# Patient Record
Sex: Male | Born: 1950 | Race: White | Hispanic: No | Marital: Married | Smoking: Former smoker
Health system: Southern US, Community
[De-identification: ages and names within clinical notes are randomized; demographics above are authoritative.]

## PROBLEM LIST (undated history)

## (undated) DIAGNOSIS — R079 Chest pain, unspecified: Secondary | ICD-10-CM

## (undated) DIAGNOSIS — G4730 Sleep apnea, unspecified: Secondary | ICD-10-CM

## (undated) DIAGNOSIS — Z87442 Personal history of urinary calculi: Secondary | ICD-10-CM

## (undated) DIAGNOSIS — I10 Essential (primary) hypertension: Secondary | ICD-10-CM

## (undated) DIAGNOSIS — M109 Gout, unspecified: Secondary | ICD-10-CM

## (undated) DIAGNOSIS — K219 Gastro-esophageal reflux disease without esophagitis: Secondary | ICD-10-CM

## (undated) DIAGNOSIS — E119 Type 2 diabetes mellitus without complications: Secondary | ICD-10-CM

## (undated) DIAGNOSIS — C4490 Unspecified malignant neoplasm of skin, unspecified: Secondary | ICD-10-CM

## (undated) DIAGNOSIS — R002 Palpitations: Secondary | ICD-10-CM

## (undated) DIAGNOSIS — I639 Cerebral infarction, unspecified: Secondary | ICD-10-CM

## (undated) DIAGNOSIS — M5030 Other cervical disc degeneration, unspecified cervical region: Secondary | ICD-10-CM

## (undated) DIAGNOSIS — G4733 Obstructive sleep apnea (adult) (pediatric): Secondary | ICD-10-CM

## (undated) DIAGNOSIS — E785 Hyperlipidemia, unspecified: Secondary | ICD-10-CM

## (undated) HISTORY — PX: FOOT SURGERY: SHX648

## (undated) HISTORY — PX: BACK SURGERY: SHX140

## (undated) HISTORY — PX: CATARACT EXTRACTION: SUR2

## (undated) HISTORY — PX: APPENDECTOMY: SHX54

## (undated) HISTORY — PX: TONSILLECTOMY: SUR1361

## (undated) HISTORY — DX: Sleep apnea, unspecified: G47.30

## (undated) HISTORY — DX: Type 2 diabetes mellitus without complications: E11.9

## (undated) HISTORY — PX: KNEE SURGERY: SHX244

## (undated) HISTORY — PX: HEMORROIDECTOMY: SUR656

---

## 2005-02-25 ENCOUNTER — Ambulatory Visit: Payer: Self-pay | Admitting: Cardiology

## 2005-02-25 ENCOUNTER — Ambulatory Visit (HOSPITAL_COMMUNITY): Admission: AD | Admit: 2005-02-25 | Discharge: 2005-02-25 | Payer: Self-pay | Admitting: Cardiology

## 2006-01-02 ENCOUNTER — Emergency Department (HOSPITAL_COMMUNITY): Admission: EM | Admit: 2006-01-02 | Discharge: 2006-01-02 | Payer: Self-pay | Admitting: Emergency Medicine

## 2008-08-27 ENCOUNTER — Encounter: Payer: Self-pay | Admitting: Internal Medicine

## 2008-08-27 ENCOUNTER — Ambulatory Visit (HOSPITAL_COMMUNITY): Admission: RE | Admit: 2008-08-27 | Discharge: 2008-08-28 | Payer: Self-pay | Admitting: Neurosurgery

## 2008-11-28 ENCOUNTER — Ambulatory Visit: Payer: Self-pay

## 2008-11-28 ENCOUNTER — Ambulatory Visit: Payer: Self-pay | Admitting: Internal Medicine

## 2008-11-28 ENCOUNTER — Encounter: Payer: Self-pay | Admitting: Internal Medicine

## 2008-11-28 ENCOUNTER — Encounter: Payer: Self-pay | Admitting: Cardiology

## 2008-11-28 DIAGNOSIS — E785 Hyperlipidemia, unspecified: Secondary | ICD-10-CM | POA: Insufficient documentation

## 2008-11-28 DIAGNOSIS — R509 Fever, unspecified: Secondary | ICD-10-CM

## 2008-11-28 DIAGNOSIS — R002 Palpitations: Secondary | ICD-10-CM

## 2008-11-28 DIAGNOSIS — M79609 Pain in unspecified limb: Secondary | ICD-10-CM

## 2008-11-28 DIAGNOSIS — I10 Essential (primary) hypertension: Secondary | ICD-10-CM | POA: Insufficient documentation

## 2008-11-28 DIAGNOSIS — R0602 Shortness of breath: Secondary | ICD-10-CM

## 2008-11-28 DIAGNOSIS — M549 Dorsalgia, unspecified: Secondary | ICD-10-CM | POA: Insufficient documentation

## 2008-11-28 DIAGNOSIS — R0789 Other chest pain: Secondary | ICD-10-CM

## 2008-11-28 DIAGNOSIS — Z85828 Personal history of other malignant neoplasm of skin: Secondary | ICD-10-CM

## 2008-11-28 LAB — CONVERTED CEMR LAB
Basophils Relative: 0.2 % (ref 0.0–3.0)
CO2: 29 meq/L (ref 19–32)
Chloride: 102 meq/L (ref 96–112)
Creatinine, Ser: 1 mg/dL (ref 0.4–1.5)
Eosinophils Relative: 1.1 % (ref 0.0–5.0)
GFR calc non Af Amer: 81.29 mL/min (ref >60)
Glucose, Bld: 88 mg/dL (ref 70–99)
Lymphocytes Relative: 19.4 % (ref 12.0–46.0)
Lymphs Abs: 1.5 10*3/uL (ref 0.7–4.0)
MCHC: 34 g/dL (ref 30.0–36.0)
MCV: 93 fL (ref 78.0–100.0)
Neutrophils Relative %: 64.1 % (ref 43.0–77.0)
Platelets: 148 10*3/uL — ABNORMAL LOW (ref 150.0–400.0)
RDW: 12.8 % (ref 11.5–14.6)
Total Protein: 7.3 g/dL (ref 6.0–8.3)

## 2009-01-01 ENCOUNTER — Ambulatory Visit: Payer: Self-pay | Admitting: Internal Medicine

## 2009-01-06 ENCOUNTER — Telehealth (INDEPENDENT_AMBULATORY_CARE_PROVIDER_SITE_OTHER): Payer: Self-pay | Admitting: *Deleted

## 2009-01-07 ENCOUNTER — Encounter: Payer: Self-pay | Admitting: Cardiovascular Disease

## 2009-01-07 ENCOUNTER — Ambulatory Visit: Payer: Self-pay

## 2010-05-15 ENCOUNTER — Inpatient Hospital Stay (HOSPITAL_COMMUNITY)
Admission: EM | Admit: 2010-05-15 | Discharge: 2010-05-17 | Payer: Self-pay | Source: Home / Self Care | Attending: Neurology | Admitting: Neurology

## 2010-05-15 ENCOUNTER — Encounter
Admission: RE | Admit: 2010-05-15 | Discharge: 2010-05-15 | Payer: Self-pay | Source: Home / Self Care | Attending: Otolaryngology | Admitting: Otolaryngology

## 2010-05-16 ENCOUNTER — Encounter (INDEPENDENT_AMBULATORY_CARE_PROVIDER_SITE_OTHER): Payer: Self-pay | Admitting: Neurology

## 2010-05-20 ENCOUNTER — Encounter: Payer: Self-pay | Admitting: Internal Medicine

## 2010-05-20 ENCOUNTER — Ambulatory Visit: Admission: RE | Admit: 2010-05-20 | Discharge: 2010-05-20 | Payer: Self-pay | Source: Home / Self Care

## 2010-05-20 ENCOUNTER — Ambulatory Visit (HOSPITAL_COMMUNITY)
Admission: RE | Admit: 2010-05-20 | Discharge: 2010-05-20 | Payer: Self-pay | Source: Home / Self Care | Attending: Internal Medicine | Admitting: Internal Medicine

## 2010-06-14 ENCOUNTER — Encounter: Payer: Self-pay | Admitting: Otolaryngology

## 2010-08-03 LAB — COMPREHENSIVE METABOLIC PANEL
ALT: 43 U/L (ref 0–53)
AST: 48 U/L — ABNORMAL HIGH (ref 0–37)
Albumin: 3.4 g/dL — ABNORMAL LOW (ref 3.5–5.2)
Alkaline Phosphatase: 62 U/L (ref 39–117)
BUN: 8 mg/dL (ref 6–23)
CO2: 28 mEq/L (ref 19–32)
Calcium: 8.1 mg/dL — ABNORMAL LOW (ref 8.4–10.5)
Chloride: 105 mEq/L (ref 96–112)
Creatinine, Ser: 1.04 mg/dL (ref 0.4–1.5)
GFR calc Af Amer: >60 mL/min (ref >60)
GFR calc non Af Amer: >60 mL/min (ref >60)
GFR calc non Af Amer: >60 mL/min (ref >60)
Glucose, Bld: 102 mg/dL — ABNORMAL HIGH (ref 70–99)
Total Protein: 6.2 g/dL (ref 6.0–8.3)
Total Protein: 7 g/dL (ref 6.0–8.3)

## 2010-08-03 LAB — DIFFERENTIAL
Eosinophils Absolute: 0 10*3/uL (ref 0.0–0.7)
Lymphocytes Relative: 33 % (ref 12–46)
Lymphs Abs: 2.3 10*3/uL (ref 0.7–4.0)
Neutrophils Relative %: 62 % (ref 43–77)

## 2010-08-03 LAB — APTT
aPTT: 27 seconds (ref 24–37)
aPTT: 27 seconds (ref 24–37)

## 2010-08-03 LAB — PROTIME-INR
INR: 1.15 (ref 0.00–1.49)
Prothrombin Time: 15.2 seconds (ref 11.6–15.2)

## 2010-08-03 LAB — CK TOTAL AND CKMB (NOT AT ARMC): Relative Index: 1.7 (ref 0.0–2.5)

## 2010-08-03 LAB — LIPID PANEL
Cholesterol: 69 mg/dL (ref 0–200)
HDL: 25 mg/dL — ABNORMAL LOW (ref >39)
LDL Cholesterol: 28 mg/dL (ref 0–99)
Triglycerides: 79 mg/dL (ref <150)

## 2010-08-03 LAB — CBC
HCT: 46 % (ref 39.0–52.0)
Hemoglobin: 15.6 g/dL (ref 13.0–17.0)
MCHC: 33.9 g/dL (ref 30.0–36.0)
Platelets: 164 10*3/uL (ref 150–400)
RBC: 4.99 MIL/uL (ref 4.22–5.81)

## 2010-08-03 LAB — GLUCOSE, CAPILLARY: Glucose-Capillary: 90 mg/dL (ref 70–99)

## 2010-08-03 LAB — HEMOGLOBIN A1C: Mean Plasma Glucose: 140 mg/dL — ABNORMAL HIGH (ref <117)

## 2010-09-02 LAB — CBC
MCHC: 34 g/dL (ref 30.0–36.0)
MCV: 93.8 fL (ref 78.0–100.0)
RDW: 14 % (ref 11.5–15.5)

## 2010-09-02 LAB — BASIC METABOLIC PANEL
CO2: 32 mEq/L (ref 19–32)
Calcium: 9.3 mg/dL (ref 8.4–10.5)
Chloride: 101 mEq/L (ref 96–112)
Creatinine, Ser: 1 mg/dL (ref 0.4–1.5)
Glucose, Bld: 101 mg/dL — ABNORMAL HIGH (ref 70–99)

## 2010-10-06 NOTE — H&P (Signed)
Alexander [name], Alexander [name]                   ACCOUNT NO.:  [id number]   MEDICAL RECORD NO.:  [id number]          PATIENT TYPE:  OIB   LOCATION:  3015                         FACILITY:  [facility]   PHYSICIAN:  Hilda Lias, M.D.   DATE OF BIRTH:  01-Sep-1950   DATE OF ADMISSION:  08/27/2008  DATE OF DISCHARGE:                              HISTORY & PHYSICAL   Alexander [name] is a gentleman who was seen by me about 8 weeks ago for back  pain radiating to the right leg.  The patient has had conservative  treatment.  He had an MRI.  This patient in 1988 underwent a right 4-5  and 5-1 discectomy .  He denies any pain in the left leg.  He denies  problems with any back pain.  He has epidural injections, conservative  treatment with medication.  He came yesterday with his wife, things  again getting worse for more weakness, and he wants to proceed with  surgery.   PAST MEDICAL HISTORY:  A 4-5, 5-1 diskectomy in 1998.   ALLERGIES:  He is not allergic to any medications.   SOCIAL HISTORY:  Positive for back pain, right leg pain, and high blood  pressure.   PHYSICAL EXAMINATION:  GENERAL:  The patient came to my office with  right leg pain.  HEAD, EARS, NOSE, AND THROAT:  Normal.  NECK:  Normal.  LUNGS:  There is mild rhonchi bilaterally.  CARDIOVASCULAR:  Normal.  ABDOMEN:  Normal.  EXTREMITIES:  Normal pulses.  NEUROLOGIC:  Reflexes symmetrical.  On straight leg raising,  left side  positive about 80, right side about 45 degrees.  Reflexes are 1+.   MRI showed that he has degenerative disk disease with facet arthropathy  at L4-5 and L5-S1.  The patient was recommended for surgery.  He only  has pain in the right leg and the procedure will be a right L4-5  diskectomy. He is fully aware that this problem would not address the  back pain which is secondary to degenerative arthritis as well as the  potential to have a problem with the left leg.  The risk of infection,  CSF leak, failure of the surgery,  and __________ .           ______________________________  Hilda Lias, M.D.     EB/MEDQ  D:  08/27/2008  T:  08/28/2008  Job:  [id number]

## 2010-10-06 NOTE — Op Note (Signed)
Alexander [name], Alexander [name]                   ACCOUNT NO.:  [id number]   MEDICAL RECORD NO.:  [id number]          PATIENT TYPE:  OIB   LOCATION:  3015                         FACILITY:  [facility]   PHYSICIAN:  Hilda Lias, M.D.   DATE OF BIRTH:  08/26/1950   DATE OF PROCEDURE:  08/27/2008  DATE OF DISCHARGE:                               OPERATIVE REPORT   PREOPERATIVE DIAGNOSES:  Right L4-5 herniated disk with degenerative  disk disease at L4-5, L5-S1.   POSTOPERATIVE DIAGNOSES:  Right L4-5 herniated disk with degenerative  disk disease at L4-5, L5-S1.   PROCEDURES:  Right L4-5 diskectomy, foraminotomy, and lysis of  adhesions.   SURGEON:  Hilda Lias, MD   CLINICAL HISTORY:  Mr. Nevills is a gentleman who 20 years ago underwent  surgery in the right side at the level of L4-5 L5-S1.  Lately, he had  been complaining of more back pain going to the right leg.  He had  failed conservative treatment.  X-rays showed the degenerative disk  disease with a herniated disk at the level L4-5.  Degenerative disk  disease, mostly is at L4-5, L5-S1.  The patient has minimal back pain  and some numbness in the left leg, but the main pain is in the right  leg.  Because of that, surgery was advised which did not include fusion.   PROCEDURE IN DETAIL:  The patient was taken to the OR and after  intubation, he was positioned in a prone manner.  The skin was cleaned  with DuraPrep.  Midline incision following the previous wound was made  through the skin and subcutaneous tissue.  We found quite a bit of scar  tissue.  Lysis was accomplished.  We brought the microscope into the  area after we introduced a retractor.  The first x-rays showed that we  were at the level of L5-S1.  From then on, using the drill as well as  the microcurette, we started working on our way at the level of L4-5.  The patient previously had a L5 hemilaminectomy and lysis of adhesion  was accomplished, we found the thecal sac.  Then  we were able to retract  the thecal sac and we entered the disk space, quite a bit of  degenerative disk disease was removed.  We were able to clean up of scar  tissue, normally the L5, L4 nerve root.  From then on, we worked our way  down and we found quite a bit of adhesion at the level of L5-S1  involving the S1 nerve root.  There was some narrowing of the foramen  and foraminotomy was accomplished.  Then, we had a decompression of the  L4, L5, S1 nerve root.  The area was irrigated.  Valsalva maneuver was  negative.  Fentanyl and Depo-Medrol were left in the dural space and the  wound was closed with Vicryl and Steri-Strips.           ______________________________  Hilda Lias, M.D.     EB/MEDQ  D:  08/27/2008  T:  08/28/2008  Job:  [id number]

## 2010-10-09 NOTE — Discharge Summary (Signed)
NAMECAIDON, FOTI                   ACCOUNT NO.:  [id number]   MEDICAL RECORD NO.:  [id number]          PATIENT TYPE:  INP   LOCATION:  2853                         FACILITY:  [facility]   PHYSICIAN:  Salvadore Farber, M.D. LHCDATE OF BIRTH:  August 15, 1950   DATE OF ADMISSION:  02/25/2005  DATE OF DISCHARGE:  02/25/2005                                 DISCHARGE SUMMARY   PROCEDURE:  Cardiac catheterization, performed February 25, 2005, by Salvadore Farber, M.D.   PRIMARY DIAGNOSIS:  Musculoskeletal chest pain.   ASSOCIATED DIAGNOSES:  Hypertension.   HOSPITAL COURSE:  Mr. Alexander [name] is a 60 year old gentleman with hypertension and  dyslipidemia, who presented with several days of left-sided sharp chest  discomfort.  He was evaluated at [facility] by Dr. Andee Lineman.  Electrocardiogram demonstrated small Q-waves in III and aVF with some  suspicious coving in the inferior ST segments without diagnostic ST  elevation.  He was hemodynamically stable.  Due to worrisome chest  discomfort, he was transferred urgently for cardiac catheterization.  I  performed this procedure and it demonstrated angiographically normal  coronary arteries with normal left ventricular size and systolic function.  Arch aortography demonstrated no evidence of aortic aneurysm or dissection.  D-dimer was within normal limits, effectively excluding pulmonary embolism  as the etiology given the low pre-test probability.  He was then discharged  home with a recommendation for follow-up with his primary care doctor should  his pain not improve over the subsequent week.  I should note that on the  week prior to admission, rib films had been normal.   DISCHARGE MEDICATIONS:  1.  Nadolol 40 mg per day as prior to hospitalization.  2.  Tylenol p.r.n. pain.   FOLLOW-UP:  The patient is to follow up with Dr. Kirstie Peri in [place].      Salvadore Farber, M.D. [facility]  Electronically Signed     WED/MEDQ  D:  06/02/2005  T:   06/02/2005  Job:  [id number]   cc:   Learta Codding, M.D. [facility]  [address]  [address]  [place]  [place] [address]   Kirstie Peri, MD  Fax: [phone]

## 2010-10-09 NOTE — Cardiovascular Report (Signed)
Alexander [name], Alexander [name]                   ACCOUNT NO.:  [id number]   MEDICAL RECORD NO.:  [id number]          PATIENT TYPE:  INP   LOCATION:  2853                         FACILITY:  [facility]   PHYSICIAN:  Salvadore Farber, M.D. LHCDATE OF BIRTH:  04/25/1951   DATE OF PROCEDURE:  02/25/2005  DATE OF DISCHARGE:                              CARDIAC CATHETERIZATION   PROCEDURE:  Left heart catheterization, left ventriculography, coronary  angiography, arch aortography, StarClose closure of the right common femoral  arteriotomy site.   INDICATIONS:  [name] is a 60 year old gentleman without prior history of  coronary disease.  He presents with 1 week of near constant left-sided chest  discomfort which is fairly focal on the lateral wall of his chest.  He  denies any trauma.  In addition to this, he had an episode of substernal  chest discomfort occurring yesterday and accompanied by some mild nausea and  light-headedness.  That symptom has not recurred.  He was seen in [place] today  by Dr. Andee Lineman, who recommended urgent angiography.  The patient was  transferred via EMS directly to the catheterization lab for this.   PROCEDURAL TECHNIQUE:  Informed consent was obtained.  Under 1% lidocaine  local anesthesia, a 5 French sheath was placed in the right common femoral  artery using the modified Seldinger technique.  Diagnostic angiography and  ventriculography were performed using JL-4, JR-4, and pigtail catheters.  The pigtail catheter was then pulled back into the aortic root.  Arch  aortography was performed by power injection.  The arteriotomy was then  closed using a StarClose device.  Complete hemostasis was obtained.   COMPLICATIONS:  None.   FINDINGS:  1.  LV:  107/7/14.  EF 65%, without regional wall motion abnormality.  2.  No aortic stenosis or mitral regurgitation.  3.  Left main:  Angiographically normal.  4.  LAD:  Large vessel, giving rise to a large first and small second  diagonal branch.  It is angiographically normal.  5.  Circumflex:  Moderate-size vessel, giving rise to a single obtuse      marginal.  It is angiographically normal.  6.  RCA:  Moderate-size dominant vessel.  It is angiographically normal.  7.  Ascending aorta and aortic arch:  No evidence of dissection or aneurysm.      There is normal origin to the great vessels.   IMPRESSION/RECOMMENDATIONS:  The patient has angiographically normal  coronary arteries, normal left ventricular size and systolic function, and a  normal ascending and transverse aorta.  I suspect a musculoskeletal etiology  to his chest discomfort.  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  Will aim for  discharge later today if the D-dimer is not elevated.      Salvadore Farber, M.D. [facility]  Electronically Signed     WED/MEDQ  D:  02/25/2005  T:  02/26/2005  Job:  [id number]

## 2010-10-09 NOTE — H&P (Signed)
NAME:  Alexander [name], Alexander [name] NO.:  [id number]   MEDICAL RECORD NO.:  [id number]           PATIENT TYPE:   LOCATION:                                 FACILITY:   PHYSICIAN:  Learta Codding, M.D. [facility]     DATE OF BIRTH:   DATE OF ADMISSION:  DATE OF DISCHARGE:                                HISTORY & PHYSICAL   REFERRING PHYSICIAN:  Dr. Sherryll Burger   CARDIOLOGIST:  Dr. Andee Lineman   REASON FOR ADMISSION:  Substernal chest pain in a 60 year old male with  possible myocardial infarction.   HISTORY OF PRESENT ILLNESS:  The patient is a 60 year old male with a  history of dyslipidemia and hypertension. The patient is the father-in-law  of Ricky Ala, our hospital coordinator. I received a call from her this  morning because of chest pains that her father-in-law had been experiencing.  We worked him into the schedule today.   Mr. Cwikla tells me that for the last week or so he has had some left-sided,  sharp chest pains. He initially went to see Dr. Sherryll Burger and got x-rays done  with rib detail which showed no fracture. Two days ago, then, he suddenly  developed more chest pain, now more of a sensation of tightness across the  precordium associated with sweatiness and dizziness. He has no syncope but  felt somewhat presyncopal. This occurred while he was shopping at [employer].  Yesterday, however, when walking to his truck he had a much more severe  episode of substernal chest tightness going across the precordium with some  radiation towards the back. He felt again clammy and sweaty and experienced  shortness of breath. Today, his pain has not completely abated. It is less  severe but still feels a tightness across the precordium. He has also been  feeling extremely fatigued and weak over the last week or so. His EKG in the  office demonstrates a possible inferior wall myocardial infarction but this  is not a diagnostic EKG. There is no definite ST elevation but there are Q  waves in lead  III and aVF, and there is some suspicious coving through the  ST segments in inferior leads.   PAST MEDICAL HISTORY:  History of basal skin cancer. Surgery of right foot  and hemorrhoid surgery as well as appendectomy and back surgery. Also,  history of hypertension.   SOCIAL HISTORY:  The patient does not smoke or drink. He is a Naval architect  and owns three trucks.   FAMILY HISTORY:  Mother died at age 40, father age 58. He has several uncles  who have had coronary artery disease or died from myocardial infarction.   MEDICATIONS:  Nadolol 40 mg a day.   REVIEW OF SYSTEMS:  As per HPI. No nausea or vomiting, no fever or chills,  no palpitations, no syncope, no abdominal pain, no claudication. The  remainder of review of systems negative.   PHYSICAL EXAMINATION:  VITAL SIGNS:  Blood pressure 132/70, heart rate is 60  beats per minute, temperature is afebrile.  NECK:  Reveals normal carotid  upstroke, no carotid bruits.  LUNGS:  Clear breath sounds.  ABDOMEN:  Soft, nontender. No rebound or guarding. Good bowel sounds.  HEART:  Regular rate and rhythm, normal S1, S2. No murmur, rubs, or gallops.  EXTREMITIES:  No cyanosis, clubbing, or edema.   Twelve-lead electrocardiogram is as outlined above. Labs are currently  pending, which were drawn prior to transfer to [facility].   PROBLEM LIST:  1.  Substernal chest pain.      1.  Rule out unstable angina/non-ST-elevation myocardial infarction.      2.  Abnormal electrocardiogram with details as outlined above.      3.  Ongoing substernal chest pain.  2.  History of hypertension.  3.  Dyslipidemia, not on statin drug therapy.  4.  History of previous surgery as outlined above.   PLAN:  1.  The patient's chest pain is very suspicious for angina. He does have EKG      changes with Q waves in III and aVF and suspicious-appearing ST segment      changes.  2.  Due to risk factor profile and ongoing chest pain, the patient has been       transferred to [facility] for a cardiac catheterization. This was      discussed with the [facility] catheterization laboratory as well as      Ricky Ala and [facility] will transfer the patient to the      catheterization laboratory.  3.  I gave the patient aspirin and nitroglycerin in the office. We also drew      his blood in the office so that it would be available by the time he      gets to [facility] for his cardiac catheterization.      Learta Codding, M.D. [facility]  Electronically Signed     GED/MEDQ  D:  02/25/2005  T:  02/25/2005  Job:  [id number]

## 2012-07-17 ENCOUNTER — Observation Stay (HOSPITAL_COMMUNITY)
Admission: EM | Admit: 2012-07-17 | Discharge: 2012-07-18 | Disposition: A | Discharge status: Home / Self Care | Payer: BC Managed Care – PPO | Attending: Cardiovascular Disease | Admitting: Cardiovascular Disease

## 2012-07-17 ENCOUNTER — Encounter (HOSPITAL_COMMUNITY): Payer: Self-pay | Admitting: *Deleted

## 2012-07-17 ENCOUNTER — Emergency Department (HOSPITAL_COMMUNITY): Payer: BC Managed Care – PPO

## 2012-07-17 DIAGNOSIS — E785 Hyperlipidemia, unspecified: Secondary | ICD-10-CM | POA: Diagnosis present

## 2012-07-17 DIAGNOSIS — R0602 Shortness of breath: Secondary | ICD-10-CM

## 2012-07-17 DIAGNOSIS — R072 Precordial pain: Secondary | ICD-10-CM

## 2012-07-17 DIAGNOSIS — I10 Essential (primary) hypertension: Secondary | ICD-10-CM | POA: Diagnosis present

## 2012-07-17 DIAGNOSIS — I200 Unstable angina: Secondary | ICD-10-CM

## 2012-07-17 DIAGNOSIS — R0789 Other chest pain: Principal | ICD-10-CM | POA: Insufficient documentation

## 2012-07-17 DIAGNOSIS — D696 Thrombocytopenia, unspecified: Secondary | ICD-10-CM | POA: Diagnosis present

## 2012-07-17 HISTORY — DX: Unspecified malignant neoplasm of skin, unspecified: C44.90

## 2012-07-17 HISTORY — DX: Chest pain, unspecified: R07.9

## 2012-07-17 HISTORY — DX: Essential (primary) hypertension: I10

## 2012-07-17 HISTORY — DX: Other cervical disc degeneration, unspecified cervical region: M50.30

## 2012-07-17 HISTORY — DX: Palpitations: R00.2

## 2012-07-17 HISTORY — DX: Hyperlipidemia, unspecified: E78.5

## 2012-07-17 HISTORY — DX: Gout, unspecified: M10.9

## 2012-07-17 HISTORY — DX: Precordial pain: R07.2

## 2012-07-17 HISTORY — DX: Obstructive sleep apnea (adult) (pediatric): G47.33

## 2012-07-17 HISTORY — DX: Cerebral infarction, unspecified: I63.9

## 2012-07-17 LAB — PRO B NATRIURETIC PEPTIDE: Pro B Natriuretic peptide (BNP): 68.8 pg/mL (ref 0–125)

## 2012-07-17 LAB — COMPREHENSIVE METABOLIC PANEL
AST: 48 U/L — ABNORMAL HIGH (ref 0–37)
Albumin: 3.8 g/dL (ref 3.5–5.2)
BUN: 12 mg/dL (ref 6–23)
Chloride: 101 mEq/L (ref 96–112)
Creatinine, Ser: 0.96 mg/dL (ref 0.50–1.35)
Total Bilirubin: 0.5 mg/dL (ref 0.3–1.2)
Total Protein: 8.3 g/dL (ref 6.0–8.3)

## 2012-07-17 LAB — TROPONIN I
Troponin I: <0.3 ng/mL (ref <0.30)
Troponin I: <0.3 ng/mL (ref <0.30)

## 2012-07-17 LAB — CBC WITH DIFFERENTIAL/PLATELET
Basophils Absolute: 0.1 10*3/uL (ref 0.0–0.1)
Basophils Relative: 0 % (ref 0–1)
Eosinophils Absolute: 0.2 10*3/uL (ref 0.0–0.7)
HCT: 43.3 % (ref 39.0–52.0)
MCH: 32.1 pg (ref 26.0–34.0)
MCHC: 34.9 g/dL (ref 30.0–36.0)
Monocytes Absolute: 1 10*3/uL (ref 0.1–1.0)
Monocytes Relative: 8 % (ref 3–12)
Neutro Abs: 7.7 10*3/uL (ref 1.7–7.7)
Neutrophils Relative %: 67 % (ref 43–77)
RDW: 14 % (ref 11.5–15.5)

## 2012-07-17 LAB — PROTIME-INR
INR: 1.08 (ref 0.00–1.49)
Prothrombin Time: 13.9 seconds (ref 11.6–15.2)

## 2012-07-17 MED ORDER — HEPARIN (PORCINE) IN NACL 100-0.45 UNIT/ML-% IJ SOLN
1650.0000 [IU]/h | INTRAMUSCULAR | Status: DC
  Administered 2012-07-17: 1400 [IU]/h via INTRAVENOUS
  Filled 2012-07-17 (×2): qty 250

## 2012-07-17 MED ORDER — ASPIRIN EC 81 MG PO TBEC
81.0000 mg | DELAYED_RELEASE_TABLET | Freq: Every day | ORAL | Status: DC
  Filled 2012-07-17: qty 1

## 2012-07-17 MED ORDER — NITROGLYCERIN 2 % TD OINT: 0.5000 [in_us] | TOPICAL_OINTMENT | Freq: Four times a day (QID) | TRANSDERMAL | Status: DC

## 2012-07-17 MED ORDER — SODIUM CHLORIDE 0.9 % IV SOLN: 250.0000 mL | INTRAVENOUS | Status: DC | PRN

## 2012-07-17 MED ORDER — SODIUM CHLORIDE 0.9 % IV SOLN: INTRAVENOUS | Status: DC

## 2012-07-17 MED ORDER — ASPIRIN 81 MG PO CHEW
324.0000 mg | CHEWABLE_TABLET | ORAL | Status: AC
  Administered 2012-07-17: 324 mg via ORAL
  Filled 2012-07-17: qty 4

## 2012-07-17 MED ORDER — SODIUM CHLORIDE 0.9 % IJ SOLN: 3.0000 mL | INTRAMUSCULAR | Status: DC | PRN

## 2012-07-17 MED ORDER — HEPARIN BOLUS VIA INFUSION
4000.0000 [IU] | Freq: Once | INTRAVENOUS | Status: AC
  Administered 2012-07-17: 4000 [IU] via INTRAVENOUS

## 2012-07-17 MED ORDER — SODIUM CHLORIDE 0.9 % IJ SOLN: 3.0000 mL | Freq: Two times a day (BID) | INTRAMUSCULAR | Status: DC

## 2012-07-17 MED ORDER — ASPIRIN 81 MG PO CHEW
324.0000 mg | CHEWABLE_TABLET | ORAL | Status: AC
  Administered 2012-07-18: 324 mg via ORAL
  Filled 2012-07-17: qty 4

## 2012-07-17 MED ORDER — ONDANSETRON HCL 4 MG/2ML IJ SOLN: 4.0000 mg | Freq: Four times a day (QID) | INTRAMUSCULAR | Status: DC | PRN

## 2012-07-17 MED ORDER — NITROGLYCERIN 0.4 MG SL SUBL: 0.4000 mg | SUBLINGUAL_TABLET | SUBLINGUAL | Status: DC | PRN

## 2012-07-17 MED ORDER — ACETAMINOPHEN 325 MG PO TABS: 650.0000 mg | ORAL_TABLET | ORAL | Status: DC | PRN

## 2012-07-17 MED ORDER — ASPIRIN 300 MG RE SUPP: 300.0000 mg | RECTAL | Status: AC

## 2012-07-17 NOTE — ED Notes (Signed)
Pt is here with chest pain and shortness of breath that developed after walking to mailbox.  Pt reports hard to get deep breath.  Pain radiated from left lower rib area up to left chest and down left arm.  Pain decreased with rest

## 2012-07-17 NOTE — H&P (Signed)
Patient ID: Alexander [name] MRN: [id number], DOB/AGE: Jun 18, 1950   Admit date: 07/17/2012   Primary Physician: Kirstie Peri, MD Primary Cardiologist: J. Allred, MD  Pt. Profile:  62 y/o male w/ h/o chest pain s/p nl cath in 2006 who presents secondary to chest pain.  Problem List  Past Medical History  Diagnosis Date  . Hypertension   . Stroke     a. 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  . Palpitations     a. 11/2008 - nl holter monitor;  b. 04/2010 Echo: EF 50-55%, Gr 1 DD.  Marland Kitchen Chest pain     a. 10.2006 Cath: nl cors, EF 65%;  b. 12/2008 Negative myoview.  . Hyperlipidemia   . Skin cancer   . OSA (obstructive sleep apnea)     a. on cpap  . DDD (degenerative disc disease), cervical     a. 08/2008 s/p R L4-5 diskectomy, foaminotomy, and lysis of adhesions.  . Gout     Past Surgical History  Procedure Laterality Date  . Back surgery    . Knee surgery    . Hemorroidectomy    . Foot surgery      Allergies  Allergies  Allergen Reactions  . Gadolinium      Code: VOM, Desc: unusual reaction, patient was nauseous, lost consciousness, Onset Date: [id number]    HPI  62 y/o male with the above problem list.  He was in his usoh until this afternoon when he developed chest discomfort with radiation down the left arm while walking down his driveway.  This was associated with dyspnea.  Discomfort resolved with rest.  He presented to the [facility] ED for evaluation.  He is currently pain free but reports mild dyspnea.  Home Medications  Prior to Admission medications   Nadolol 40 bid Allopurinol 300 daily Asa 81mg  daily Allegra prn   Family History  No premature cad.  Social History  History   Social History  . Marital Status: Married    Spouse Name: N/A    Number of Children: N/A  . Years of Education: N/A   Occupational History  . Not on file.   Social History Main Topics  . Smoking status: Former Games developer  . Smokeless tobacco: Not on file     Comment: quit  smoking in 1986  . Alcohol Use: No     Comment: former - quit driking in 1983.  . Drug Use: No  . Sexually Active: Not on file   Other Topics Concern  . Not on file   Social History Narrative   Lives in [place], [place] with spouse.  Works as a Naval architect.    Review of Systems General:  No chills, fever, night sweats or weight changes.  Cardiovascular:  +++ chest pain and doe.  No edema, orthopnea, palpitations, paroxysmal nocturnal dyspnea. Dermatological: No rash, lesions/masses Respiratory: No cough, dyspnea Urologic: No hematuria, dysuria Abdominal:   No nausea, vomiting, diarrhea, bright red blood per rectum, melena, or hematemesis Neurologic:  No visual changes, wkns, changes in mental status. All other systems reviewed and are otherwise negative except as noted above.  Physical Exam  Blood pressure 143/77, pulse 78, temperature 98.3 F (36.8 C), temperature source Oral, resp. rate 18, SpO2 97.00%.  General: Pleasant, NAD Psych: Normal affect. Neuro: Alert and oriented X 3. Moves all extremities spontaneously. HEENT: Normal  Neck: Supple without bruits or JVD. Lungs:  Resp regular and unlabored, CTA. Heart: RRR no s3, s4, or murmurs. Abdomen: Soft, non-tender,  non-distended, BS + x 4.  Extremities: No clubbing, cyanosis or edema. DP/PT/Radials 2+ and equal bilaterally.  Labs  Pending  Radiology/Studies  No results found.  ECG  Rsr, 75, no acute st/t changes.  ASSESSMENT AND PLAN  1.  [place]:  Symptoms concerning for angina.  Multiple RF.  He had nl cath in 2006.  Admit, cycle ce, add heparin and check d dimer as he remains dyspneic.  Plan diagnostic cath in AM.  Check echo.  2.  HTN:  bp elevated in ED.  Follow and adjust meds as needed.  Signed, Nicolasa Ducking, NP 07/17/2012, 6:26 PM  I have personally seen and examined this patient with Ward Givens, NP. I agree with the assessment and plan as outlined above. Mr. Cripps presents with chest pain and SOB  concerning for unstable angina. Will check BMET, CBC, cycle markers and check d-dimer with possibility of PE. NPO at midnight. Cardiac cath in am. Echo tomorrow.   MCALHANY,CHRISTOPHER 6:48 PM 07/17/2012

## 2012-07-17 NOTE — Progress Notes (Signed)
ANTICOAGULATION CONSULT NOTE - Initial Consult  Pharmacy Consult for UFH Indication: USAP  Allergies  Allergen Reactions  . Gadolinium      Code: VOM, Desc: unusual reaction, patient was nauseous, lost consciousness, Onset Date: [id number]     Patient Measurements: Height: 5' 10.87" (180 cm) Weight: 235 lb 14.3 oz (107 kg) IBW/kg (Calculated) : 74.99 Heparin Dosing Weight: 98kg  Vital Signs: Temp: 98.3 F (36.8 C) (02/24 1811) Temp src: Oral (02/24 1811) BP: 120/59 mmHg (02/24 2045) Pulse Rate: 78 (02/24 1811)  Labs:  Recent Labs  07/17/12 1828 07/17/12 1829  HGB 15.1  --   HCT 43.3  --   PLT 127*  --   CREATININE 0.96  --   TROPONINI  --  <0.30    Estimated Creatinine Clearance: 99.1 ml/min (by C-G formula based on Cr of 0.96).   Medical History: Past Medical History  Diagnosis Date  . Hypertension   . Stroke     a. 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  . Palpitations     a. 11/2008 - nl holter monitor;  b. 04/2010 Echo: EF 50-55%, Gr 1 DD.  Marland Kitchen Chest pain     a. 10.2006 Cath: nl cors, EF 65%;  b. 12/2008 Negative myoview.  . Hyperlipidemia   . Skin cancer   . OSA (obstructive sleep apnea)     a. on cpap  . DDD (degenerative disc disease), cervical     a. 08/2008 s/p R L4-5 diskectomy, foaminotomy, and lysis of adhesions.  . Gout     Medications:   (Not in a hospital admission)  Assessment: 62 y/o male patient admitted with chest pain requiring anticoagulation for r/o MI. EKG wnl, cardiac enzymes negative x1.  Goal of Therapy:  Heparin level 0.3-0.7 units/ml Monitor platelets by anticoagulation protocol: Yes   Plan:  Heparin 4000 unit IV bolus followed by infusion at 1400 units/hr. Check 6 hour heparin level with daily cbc and heparin level.  Verlene Mayer, PharmD, BCPS Pager [phone] 07/17/2012,9:02 PM

## 2012-07-18 ENCOUNTER — Encounter (HOSPITAL_COMMUNITY): Payer: Self-pay

## 2012-07-18 ENCOUNTER — Encounter (HOSPITAL_COMMUNITY)
Admission: EM | Disposition: A | Discharge status: Home / Self Care | Payer: Self-pay | Source: Home / Self Care | Attending: Emergency Medicine

## 2012-07-18 DIAGNOSIS — R079 Chest pain, unspecified: Secondary | ICD-10-CM

## 2012-07-18 DIAGNOSIS — R0602 Shortness of breath: Secondary | ICD-10-CM

## 2012-07-18 DIAGNOSIS — D696 Thrombocytopenia, unspecified: Secondary | ICD-10-CM | POA: Diagnosis present

## 2012-07-18 HISTORY — PX: LEFT HEART CATHETERIZATION WITH CORONARY ANGIOGRAM: SHX5451

## 2012-07-18 LAB — LIPID PANEL
HDL: 27 mg/dL — ABNORMAL LOW (ref >39)
LDL Cholesterol: 25 mg/dL (ref 0–99)
Total CHOL/HDL Ratio: 2.9 RATIO
Triglycerides: 136 mg/dL (ref <150)
VLDL: 27 mg/dL (ref 0–40)

## 2012-07-18 LAB — BASIC METABOLIC PANEL
Calcium: 9.8 mg/dL (ref 8.4–10.5)
Creatinine, Ser: 1.03 mg/dL (ref 0.50–1.35)
GFR calc Af Amer: 88 mL/min — ABNORMAL LOW (ref >90)
GFR calc non Af Amer: 76 mL/min — ABNORMAL LOW (ref >90)
Sodium: 138 mEq/L (ref 135–145)

## 2012-07-18 LAB — TROPONIN I
Troponin I: <0.3 ng/mL (ref <0.30)
Troponin I: <0.3 ng/mL (ref <0.30)

## 2012-07-18 LAB — HEPARIN LEVEL (UNFRACTIONATED): Heparin Unfractionated: 0.26 IU/mL — ABNORMAL LOW (ref 0.30–0.70)

## 2012-07-18 LAB — HEMOGLOBIN A1C: Hgb A1c MFr Bld: 6.2 % — ABNORMAL HIGH (ref <5.7)

## 2012-07-18 LAB — CBC
MCV: 92.8 fL (ref 78.0–100.0)
Platelets: 113 10*3/uL — ABNORMAL LOW (ref 150–400)
RBC: 4.46 MIL/uL (ref 4.22–5.81)
RDW: 14.1 % (ref 11.5–15.5)
WBC: 8.7 10*3/uL (ref 4.0–10.5)

## 2012-07-18 SURGERY — LEFT HEART CATHETERIZATION WITH CORONARY ANGIOGRAM
Anesthesia: LOCAL

## 2012-07-18 MED ORDER — PERFLUTREN LIPID MICROSPHERE
1.0000 mL | INTRAVENOUS | Status: AC | PRN
  Administered 2012-07-18: 2 mL via INTRAVENOUS
  Administered 2012-07-18: 13:00:00 1 mL via INTRAVENOUS
  Filled 2012-07-18: qty 10

## 2012-07-18 MED ORDER — MIDAZOLAM HCL 2 MG/2ML IJ SOLN
INTRAMUSCULAR | Status: AC
  Filled 2012-07-18: qty 2

## 2012-07-18 MED ORDER — HEPARIN (PORCINE) IN NACL 2-0.9 UNIT/ML-% IJ SOLN
INTRAMUSCULAR | Status: AC
  Filled 2012-07-18: qty 1000

## 2012-07-18 MED ORDER — LIDOCAINE HCL (PF) 1 % IJ SOLN
INTRAMUSCULAR | Status: AC
  Filled 2012-07-18: qty 30

## 2012-07-18 MED ORDER — HEPARIN SODIUM (PORCINE) 1000 UNIT/ML IJ SOLN
INTRAMUSCULAR | Status: AC
  Filled 2012-07-18: qty 1

## 2012-07-18 MED ORDER — VERAPAMIL HCL 2.5 MG/ML IV SOLN
INTRAVENOUS | Status: AC
  Filled 2012-07-18: qty 2

## 2012-07-18 MED ORDER — FENTANYL CITRATE 0.05 MG/ML IJ SOLN
INTRAMUSCULAR | Status: AC
  Filled 2012-07-18: qty 2

## 2012-07-18 MED ORDER — DIPHENHYDRAMINE HCL 50 MG/ML IJ SOLN
INTRAMUSCULAR | Status: AC
  Filled 2012-07-18: qty 1

## 2012-07-18 MED ORDER — DIPHENHYDRAMINE HCL 50 MG/ML IJ SOLN
25.0000 mg | INTRAMUSCULAR | Status: AC
  Administered 2012-07-18: 07:00:00 via INTRAVENOUS

## 2012-07-18 MED ORDER — METHYLPREDNISOLONE SODIUM SUCC 125 MG IJ SOLR
125.0000 mg | INTRAMUSCULAR | Status: AC
  Administered 2012-07-18: 125 mg via INTRAVENOUS
  Filled 2012-07-18: qty 2

## 2012-07-18 MED ORDER — FAMOTIDINE IN NACL 20-0.9 MG/50ML-% IV SOLN
20.0000 mg | INTRAVENOUS | Status: AC
  Administered 2012-07-18: 20 mg via INTRAVENOUS
  Filled 2012-07-18: qty 50

## 2012-07-18 MED ORDER — PERFLUTREN LIPID MICROSPHERE
INTRAVENOUS | Status: AC
  Filled 2012-07-18: qty 10

## 2012-07-18 MED ORDER — SODIUM CHLORIDE 0.9 % IV SOLN: INTRAVENOUS | Status: AC

## 2012-07-18 NOTE — Progress Notes (Signed)
ANTICOAGULATION CONSULT NOTE  Pharmacy Consult for UFH Indication: USAP  Allergies  Allergen Reactions  . Gadolinium      Code: VOM, Desc: unusual reaction, patient was nauseous, lost consciousness, Onset Date: 09811914     Patient Measurements: Height: 5\' 11"  (180.3 cm) Weight: 254 lb 9.6 oz (115.486 kg) IBW/kg (Calculated) : 75.3 Heparin Dosing Weight: 98kg  Vital Signs: Temp: 98.6 F (37 C) (02/24 2209) Temp src: Oral (02/24 2209) BP: 116/63 mmHg (02/24 2209) Pulse Rate: 70 (02/24 2209)  Labs:  Recent Labs  07/17/12 1828 07/17/12 1829 07/17/12 2120 07/18/12 0201 07/18/12 0440  HGB 15.1  --   --   --  14.2  HCT 43.3  --   --   --  41.4  PLT 127*  --   --   --  PENDING  APTT  --   --  34  --   --   LABPROT  --   --  13.9  --   --   INR  --   --  1.08  --   --   HEPARINUNFRC  --   --   --   --  0.26*  CREATININE 0.96  --   --   --  1.03  TROPONINI  --  <0.30 <0.30 <0.30  --     Estimated Creatinine Clearance: 96.1 ml/min (by C-G formula based on Cr of 1.03).   Medical History: Past Medical History  Diagnosis Date  . Hypertension   . Stroke     a. 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  . Palpitations     a. 11/2008 - nl holter monitor;  b. 04/2010 Echo: EF 50-55%, Gr 1 DD.  Marland Kitchen Chest pain     a. 10.2006 Cath: nl cors, EF 65%;  b. 12/2008 Negative myoview.  . Hyperlipidemia   . Skin cancer   . OSA (obstructive sleep apnea)     a. on cpap  . DDD (degenerative disc disease), cervical     a. 08/2008 s/p R L4-5 diskectomy, foaminotomy, and lysis of adhesions.  . Gout     Medications:  Prescriptions prior to admission  Medication Sig Dispense Refill  . allopurinol (ZYLOPRIM) 300 MG tablet Take 300 mg by mouth every evening.      Marland Kitchen aspirin EC 81 MG tablet Take 81 mg by mouth every evening.      . nadolol (CORGARD) 40 MG tablet Take 80 mg by mouth every evening.      Marland Kitchen omeprazole (PRILOSEC) 20 MG capsule Take 20 mg by mouth every evening.      .  fexofenadine (ALLEGRA) 180 MG tablet Take 180 mg by mouth daily as needed (allergies).        Assessment: 62 y/o male with chest pain for heparin  Goal of Therapy:  Heparin level 0.3-0.7 units/ml Monitor platelets by anticoagulation protocol: Yes   Plan:  Increase Heparin 1650 units/hr F/U after cath  Geannie Risen, PharmD, BCPS  07/18/2012,5:36 AM

## 2012-07-18 NOTE — CV Procedure (Signed)
    Cardiac Catheterization Operative Report  Alexander [name] [id number] 2/25/20148:28 AM Kirstie Peri, MD  Procedure Performed:  1. Left Heart Catheterization 2. Selective Coronary Angiography 3. Left ventricular angiogram  Operator: Verne Carrow, MD  Arterial access site:  Right radial artery.   Indication:   62 yo male with history of HTN and former tobacco use who was admitted with chest pain concerning for unstable angina. Cardiac markers were negative. Cardiac cath to exclude obstructive CAD.                                   Procedure Details: The risks, benefits, complications, treatment options, and expected outcomes were discussed with the patient. The patient and/or family concurred with the proposed plan, giving informed consent. The patient was brought to the cath lab after IV hydration was begun and oral premedication was given. The patient was further sedated with Versed and Fentanyl. The right wrist was assessed with an Allens test which was positive. The right wrist was prepped and draped in a sterile fashion. 1% lidocaine was used for local anesthesia. Using the modified Seldinger access technique, a 5 French sheath was placed in the right radial artery. 3 mg Verapamil was given through the sheath. 5500 units IV heparin was given. Standard diagnostic catheters were used to perform selective coronary angiography. A pigtail catheter was used to perform a left ventricular angiogram. The sheath was removed from the right radial artery and a Terumo hemostasis band was applied at the arteriotomy site on the right wrist.    There were no immediate complications. The patient was taken to the recovery area in stable condition.   Hemodynamic Findings: Central aortic pressure: 106/61 Left ventricular pressure: 104/10/18  Angiographic Findings:  Left main: No obstructive disease noted.   Left Anterior Descending Artery: Large caliber vessel that courses to the apex. There  is an early, moderate sized diagonal branch. There is no obstructive disease noted.   Circumflex Artery: Moderate caliber vessel with a moderate caliber obtuse marginal branch. No obstructive disease noted.   Right Coronary Artery: Large caliber, dominant vessel with no obstructive disease noted.   Left Ventricular Angiogram: LVEF=60-65%. No wall motion abnormalities noted. No MR.   Impression: 1. No angiographic evidence of CAD 2. Normal LV systolic function 3. Non-cardiac chest pain  Recommendations: No further ischemic workup. Will plan echo later this am and then discharge home later today.        Complications:  None. The patient tolerated the procedure well.

## 2012-07-18 NOTE — Interval H&P Note (Signed)
History and Physical Interval Note:  07/18/2012 8:01 AM  Alexander [name]  has presented today for cardiac cath with the diagnosis of cp  The various methods of treatment have been discussed with the patient and family. After consideration of risks, benefits and other options for treatment, the patient has consented to  Procedure(s): LEFT HEART CATHETERIZATION WITH CORONARY ANGIOGRAM (N/A) as a surgical intervention .  The patient's history has been reviewed, patient examined, no change in status, stable for surgery.  I have reviewed the patient's chart and labs.  Questions were answered to the patient's satisfaction.     MCALHANY,CHRISTOPHER

## 2012-07-18 NOTE — Progress Notes (Signed)
Utilization Review Completed Alexander [name] J. Prateek Knipple, RN, BSN, NCM [phone]  

## 2012-07-18 NOTE — Discharge Summary (Signed)
See cath note today. cdm 

## 2012-07-18 NOTE — Discharge Summary (Signed)
CARDIOLOGY DISCHARGE SUMMARY   Patient ID: Alexander [name] MRN: [id number] DOB/AGE: 63-Nov-1952 63 y.o.  Admit date: 07/17/2012 Discharge date: 07/18/2012  Primary Discharge Diagnosis:     Precordial pain  Secondary Discharge Diagnosis:    DYSLIPIDEMIA   HYPERTENSION   Thrombocytopenia  Procedure Performed:  1. Left Heart Catheterization 2. Selective Coronary Angiography 3. Left ventricular angiogram   Hospital Course: Alexander [name] is a 62 year old male with no previous history of coronary artery disease. He had chest pain concerning for unstable angina. He was admitted for further evaluation and treatment.  His cardiac enzymes were negative for MI. His labs were reviewed and had no significant abnormalities except thrombocytopenia. His platelets were 127 on admission. They decreased to 113 but there were no signs or symptoms of bleeding.  Because of multiple cardiac risk factors and symptoms that were concerning for unstable angina, he was taken to the cath lab on 07/18/2012. Full cath results are below. He had no significant coronary artery disease. Dr. Clifton James reviewed the films and recommended medical therapy with cardiac risk factor reduction and consideration of non-cardiac causes of chest pain. A BNP and chest x-ray are without significant abnormalities. A 2-D echocardiogram was ordered to further evaluate his ventricles and valves. The full results are below. There were no significant abnormalities.  On 07/18/2012, Alexander [name] was ambulating without chest pain or shortness of breath post-cath. His symptoms had resolved. His vital signs and cath site were stable. He is considered stable for discharge, to follow up as an outpatient.  Labs:   Lab Results  Component Value Date   WBC 8.7 07/18/2012   HGB 14.2 07/18/2012   HCT 41.4 07/18/2012   MCV 92.8 07/18/2012   PLT 113* 07/18/2012     Recent Labs Lab 07/17/12 1828 07/18/12 0440  NA 140 138  K 4.0 3.7  CL 101 100  CO2 32 29    BUN 12 12  CREATININE 0.96 1.03  CALCIUM 10.1 9.8  PROT 8.3  --   BILITOT 0.5  --   ALKPHOS 75  --   ALT 38  --   AST 48*  --   GLUCOSE 91 157*    Recent Labs  07/17/12 2120 07/18/12 0201 07/18/12 0942  TROPONINI <0.30 <0.30 <0.30   Lipid Panel     Component Value Date/Time   CHOL 79 07/18/2012 0440   TRIG 136 07/18/2012 0440   HDL 27* 07/18/2012 0440   CHOLHDL 2.9 07/18/2012 0440   VLDL 27 07/18/2012 0440   LDLCALC 25 07/18/2012 0440    Pro B Natriuretic peptide (BNP)  Date/Time Value Range Status  07/17/2012  6:29 PM 68.8  0 - 125 pg/mL Final    Recent Labs  07/17/12 2120  INR 1.08      Radiology: Dg Chest 2 View 07/17/2012  *RADIOLOGY REPORT*  Clinical Data: Left chest and arm pain scarring at 4:00 p.m., history hypertension, stroke, former smoker  CHEST - 2 VIEW  Comparison: 11/28/2008  Findings: Normal heart size, mediastinal contours, and pulmonary vascularity. Lungs clear. No pleural effusion or pneumothorax. Bones unremarkable.  IMPRESSION: No acute abnormalities.   Original Report Authenticated By: Ulyses Southward, M.D.     Cardiac Cath: 07/18/2012 Left main: No obstructive disease noted.  Left Anterior Descending Artery: Large caliber vessel that courses to the apex. There is an early, moderate sized diagonal branch. There is no obstructive disease noted.  Circumflex Artery: Moderate caliber vessel with a moderate caliber obtuse marginal branch. No  obstructive disease noted.  Right Coronary Artery: Large caliber, dominant vessel with no obstructive disease noted.  Left Ventricular Angiogram: LVEF=60-65%. No wall motion abnormalities noted. No MR.  Impression:  1. No angiographic evidence of CAD  2. Normal LV systolic function  3. Non-cardiac chest pain   EKG: 18-Jul-2012 04:51:38 [facility] ROUTINE RECORD Normal sinus rhythm Inferior infarct , age undetermined Abnormal ECG 43mm/s 65mm/mV 100Hz  8.0.1 12SL 241 HD CID: 1 Referred by:  Unconfirmed Vent. rate 66 BPM PR interval 206 ms QRS duration 86 ms QT/QTc 430/450 ms P-R-T axes 49 12 46   Echo:  07/18/2012 Conclusions Left ventricle: The cavity size was normal. Wall thickness was normal. Systolic function was normal. The estimated ejection fraction was in the range of 55% to 60%. Wall motion was normal; there were no regional wall motion abnormalities. Doppler parameters are consistent with abnormal left ventricular relaxation (grade 1 diastolic dysfunction). Impressions: - Extremely limited due to poor sound wave transmission; Definity used; doppler suboptimal; LV function appears to be normal.   FOLLOW UP PLANS AND APPOINTMENTS Allergies  Allergen Reactions  . Gadolinium      Code: VOM, Desc: unusual reaction, patient was nauseous, lost consciousness, Onset Date: [id number]      Medication List    TAKE these medications       allopurinol 300 MG tablet  Commonly known as:  ZYLOPRIM  Take 300 mg by mouth every evening.     aspirin EC 81 MG tablet  Take 81 mg by mouth every evening.     fexofenadine 180 MG tablet  Commonly known as:  ALLEGRA  Take 180 mg by mouth daily as needed (allergies).     nadolol 40 MG tablet  Commonly known as:  CORGARD  Take 80 mg by mouth every evening.     omeprazole 20 MG capsule  Commonly known as:  PRILOSEC  Take 20 mg by mouth every evening.        Discharge Orders   Future Appointments Provider Department Dept Phone   08/01/2012 10:10 AM Beatrice Lecher, PA [place] [facility] Main Office [place]) [phone]   Future Orders Complete By Expires     Diet - low sodium heart healthy  As directed     Increase activity slowly  As directed       Follow-up Information   Follow up with Tereso Newcomer, PA On 08/01/2012. (see for Dr Johney Frame at 10:10 am)    Contact information:   [address] [place] [address] [phone]       BRING ALL MEDICATIONS WITH YOU TO FOLLOW UP  APPOINTMENTS  Time spent with patient to include physician time: 34 min Signed: Theodore Demark 07/18/2012, 4:10 PM Co-Sign MD

## 2012-07-18 NOTE — OR Nursing (Signed)
definity pushed per protocol, during 2D Echo, Vita Erm, RN

## 2012-07-18 NOTE — Progress Notes (Signed)
Echocardiogram 2D Echocardiogram has been performed.  Xee Hollman 07/18/2012, 1:24 PM

## 2012-08-01 ENCOUNTER — Encounter: Payer: BC Managed Care – PPO | Admitting: Physician Assistant

## 2014-05-02 ENCOUNTER — Encounter (HOSPITAL_COMMUNITY): Payer: Self-pay | Admitting: Cardiovascular Disease

## 2015-06-02 DIAGNOSIS — L905 Scar conditions and fibrosis of skin: Secondary | ICD-10-CM | POA: Diagnosis not present

## 2015-06-02 DIAGNOSIS — Z87891 Personal history of nicotine dependence: Secondary | ICD-10-CM | POA: Diagnosis not present

## 2015-06-02 DIAGNOSIS — Z418 Encounter for other procedures for purposes other than remedying health state: Secondary | ICD-10-CM | POA: Diagnosis not present

## 2015-06-02 DIAGNOSIS — J069 Acute upper respiratory infection, unspecified: Secondary | ICD-10-CM | POA: Diagnosis not present

## 2015-06-02 DIAGNOSIS — L570 Actinic keratosis: Secondary | ICD-10-CM | POA: Diagnosis not present

## 2015-06-02 DIAGNOSIS — I10 Essential (primary) hypertension: Secondary | ICD-10-CM | POA: Diagnosis not present

## 2015-06-02 DIAGNOSIS — D225 Melanocytic nevi of trunk: Secondary | ICD-10-CM | POA: Diagnosis not present

## 2015-06-02 DIAGNOSIS — L821 Other seborrheic keratosis: Secondary | ICD-10-CM | POA: Diagnosis not present

## 2015-06-02 DIAGNOSIS — Z85828 Personal history of other malignant neoplasm of skin: Secondary | ICD-10-CM | POA: Diagnosis not present

## 2015-06-02 DIAGNOSIS — L820 Inflamed seborrheic keratosis: Secondary | ICD-10-CM | POA: Diagnosis not present

## 2015-07-18 DIAGNOSIS — M1A09X0 Idiopathic chronic gout, multiple sites, without tophus (tophi): Secondary | ICD-10-CM | POA: Diagnosis not present

## 2015-07-18 DIAGNOSIS — M549 Dorsalgia, unspecified: Secondary | ICD-10-CM | POA: Diagnosis not present

## 2015-09-01 DIAGNOSIS — H2512 Age-related nuclear cataract, left eye: Secondary | ICD-10-CM | POA: Diagnosis not present

## 2015-09-01 DIAGNOSIS — Z961 Presence of intraocular lens: Secondary | ICD-10-CM | POA: Diagnosis not present

## 2015-09-19 DIAGNOSIS — I639 Cerebral infarction, unspecified: Secondary | ICD-10-CM | POA: Diagnosis not present

## 2015-09-19 DIAGNOSIS — M159 Polyosteoarthritis, unspecified: Secondary | ICD-10-CM | POA: Diagnosis not present

## 2015-09-19 DIAGNOSIS — I10 Essential (primary) hypertension: Secondary | ICD-10-CM | POA: Diagnosis not present

## 2015-10-09 DIAGNOSIS — M159 Polyosteoarthritis, unspecified: Secondary | ICD-10-CM | POA: Diagnosis not present

## 2015-10-09 DIAGNOSIS — I639 Cerebral infarction, unspecified: Secondary | ICD-10-CM | POA: Diagnosis not present

## 2015-10-09 DIAGNOSIS — I10 Essential (primary) hypertension: Secondary | ICD-10-CM | POA: Diagnosis not present

## 2015-11-14 DIAGNOSIS — I639 Cerebral infarction, unspecified: Secondary | ICD-10-CM | POA: Diagnosis not present

## 2015-11-14 DIAGNOSIS — M159 Polyosteoarthritis, unspecified: Secondary | ICD-10-CM | POA: Diagnosis not present

## 2015-11-14 DIAGNOSIS — I10 Essential (primary) hypertension: Secondary | ICD-10-CM | POA: Diagnosis not present

## 2015-11-24 DIAGNOSIS — M25552 Pain in left hip: Secondary | ICD-10-CM | POA: Diagnosis not present

## 2015-11-24 DIAGNOSIS — Z299 Encounter for prophylactic measures, unspecified: Secondary | ICD-10-CM | POA: Diagnosis not present

## 2015-11-26 DIAGNOSIS — Z6835 Body mass index (BMI) 35.0-35.9, adult: Secondary | ICD-10-CM | POA: Diagnosis not present

## 2015-11-26 DIAGNOSIS — M549 Dorsalgia, unspecified: Secondary | ICD-10-CM | POA: Diagnosis not present

## 2015-11-26 DIAGNOSIS — M25552 Pain in left hip: Secondary | ICD-10-CM | POA: Diagnosis not present

## 2015-11-26 DIAGNOSIS — I10 Essential (primary) hypertension: Secondary | ICD-10-CM | POA: Diagnosis not present

## 2015-11-26 DIAGNOSIS — M5136 Other intervertebral disc degeneration, lumbar region: Secondary | ICD-10-CM | POA: Diagnosis not present

## 2015-11-26 DIAGNOSIS — M5137 Other intervertebral disc degeneration, lumbosacral region: Secondary | ICD-10-CM | POA: Diagnosis not present

## 2015-11-27 DIAGNOSIS — I639 Cerebral infarction, unspecified: Secondary | ICD-10-CM | POA: Diagnosis not present

## 2015-11-27 DIAGNOSIS — I10 Essential (primary) hypertension: Secondary | ICD-10-CM | POA: Diagnosis not present

## 2015-11-27 DIAGNOSIS — M159 Polyosteoarthritis, unspecified: Secondary | ICD-10-CM | POA: Diagnosis not present

## 2015-12-01 DIAGNOSIS — L821 Other seborrheic keratosis: Secondary | ICD-10-CM | POA: Diagnosis not present

## 2015-12-01 DIAGNOSIS — D225 Melanocytic nevi of trunk: Secondary | ICD-10-CM | POA: Diagnosis not present

## 2015-12-01 DIAGNOSIS — Z85828 Personal history of other malignant neoplasm of skin: Secondary | ICD-10-CM | POA: Diagnosis not present

## 2015-12-01 DIAGNOSIS — L905 Scar conditions and fibrosis of skin: Secondary | ICD-10-CM | POA: Diagnosis not present

## 2015-12-01 DIAGNOSIS — L820 Inflamed seborrheic keratosis: Secondary | ICD-10-CM | POA: Diagnosis not present

## 2015-12-01 DIAGNOSIS — D485 Neoplasm of uncertain behavior of skin: Secondary | ICD-10-CM | POA: Diagnosis not present

## 2015-12-01 DIAGNOSIS — L570 Actinic keratosis: Secondary | ICD-10-CM | POA: Diagnosis not present

## 2015-12-02 DIAGNOSIS — M4807 Spinal stenosis, lumbosacral region: Secondary | ICD-10-CM | POA: Diagnosis not present

## 2015-12-02 DIAGNOSIS — M5126 Other intervertebral disc displacement, lumbar region: Secondary | ICD-10-CM | POA: Diagnosis not present

## 2015-12-04 DIAGNOSIS — M549 Dorsalgia, unspecified: Secondary | ICD-10-CM | POA: Diagnosis not present

## 2015-12-04 DIAGNOSIS — I10 Essential (primary) hypertension: Secondary | ICD-10-CM | POA: Diagnosis not present

## 2015-12-04 DIAGNOSIS — I639 Cerebral infarction, unspecified: Secondary | ICD-10-CM | POA: Diagnosis not present

## 2015-12-15 DIAGNOSIS — M5416 Radiculopathy, lumbar region: Secondary | ICD-10-CM | POA: Diagnosis not present

## 2015-12-15 DIAGNOSIS — Z6834 Body mass index (BMI) 34.0-34.9, adult: Secondary | ICD-10-CM | POA: Diagnosis not present

## 2015-12-18 ENCOUNTER — Other Ambulatory Visit: Payer: Self-pay | Admitting: Neurosurgery

## 2015-12-18 DIAGNOSIS — M5416 Radiculopathy, lumbar region: Secondary | ICD-10-CM

## 2015-12-24 ENCOUNTER — Ambulatory Visit
Admission: RE | Admit: 2015-12-24 | Discharge: 2015-12-24 | Disposition: A | Discharge status: Home / Self Care | Payer: Medicare Other | Source: Ambulatory Visit | Attending: Neurosurgery | Admitting: Neurosurgery

## 2015-12-24 ENCOUNTER — Other Ambulatory Visit: Payer: Self-pay | Admitting: Neurosurgery

## 2015-12-24 DIAGNOSIS — M5126 Other intervertebral disc displacement, lumbar region: Secondary | ICD-10-CM | POA: Diagnosis not present

## 2015-12-24 DIAGNOSIS — M5416 Radiculopathy, lumbar region: Secondary | ICD-10-CM

## 2015-12-24 MED ORDER — IOPAMIDOL (ISOVUE-M 200) INJECTION 41%
1.0000 mL | Freq: Once | INTRAMUSCULAR | Status: AC
  Administered 2015-12-24: 1 mL via EPIDURAL

## 2015-12-24 MED ORDER — METHYLPREDNISOLONE ACETATE 40 MG/ML INJ SUSP (RADIOLOG
120.0000 mg | Freq: Once | INTRAMUSCULAR | Status: AC
  Administered 2015-12-24: 120 mg via EPIDURAL

## 2015-12-24 NOTE — Discharge Instructions (Signed)

## 2016-01-02 DIAGNOSIS — I10 Essential (primary) hypertension: Secondary | ICD-10-CM | POA: Diagnosis not present

## 2016-01-02 DIAGNOSIS — I639 Cerebral infarction, unspecified: Secondary | ICD-10-CM | POA: Diagnosis not present

## 2016-01-02 DIAGNOSIS — M159 Polyosteoarthritis, unspecified: Secondary | ICD-10-CM | POA: Diagnosis not present

## 2016-01-05 DIAGNOSIS — Z6834 Body mass index (BMI) 34.0-34.9, adult: Secondary | ICD-10-CM | POA: Diagnosis not present

## 2016-01-05 DIAGNOSIS — I10 Essential (primary) hypertension: Secondary | ICD-10-CM | POA: Diagnosis not present

## 2016-01-05 DIAGNOSIS — M5416 Radiculopathy, lumbar region: Secondary | ICD-10-CM | POA: Diagnosis not present

## 2016-01-09 DIAGNOSIS — M549 Dorsalgia, unspecified: Secondary | ICD-10-CM | POA: Diagnosis not present

## 2016-01-12 ENCOUNTER — Other Ambulatory Visit: Payer: Self-pay | Admitting: Neurosurgery

## 2016-01-20 ENCOUNTER — Encounter (HOSPITAL_COMMUNITY)
Admission: RE | Admit: 2016-01-20 | Discharge: 2016-01-20 | Disposition: A | Discharge status: Home / Self Care | Payer: Medicare Other | Source: Ambulatory Visit | Attending: Neurosurgery | Admitting: Neurosurgery

## 2016-01-20 ENCOUNTER — Encounter (HOSPITAL_COMMUNITY): Payer: Self-pay

## 2016-01-20 DIAGNOSIS — Z01812 Encounter for preprocedural laboratory examination: Secondary | ICD-10-CM | POA: Diagnosis not present

## 2016-01-20 DIAGNOSIS — I10 Essential (primary) hypertension: Secondary | ICD-10-CM | POA: Diagnosis not present

## 2016-01-20 HISTORY — DX: Gastro-esophageal reflux disease without esophagitis: K21.9

## 2016-01-20 HISTORY — DX: Personal history of urinary calculi: Z87.442

## 2016-01-20 LAB — BASIC METABOLIC PANEL
Anion gap: 8 (ref 5–15)
BUN: 10 mg/dL (ref 6–20)
CHLORIDE: 99 mmol/L — AB (ref 101–111)
CO2: 30 mmol/L (ref 22–32)
CREATININE: 0.97 mg/dL (ref 0.61–1.24)
Calcium: 9.8 mg/dL (ref 8.9–10.3)
GFR calc Af Amer: >60 mL/min (ref >60)
GFR calc non Af Amer: >60 mL/min (ref >60)
GLUCOSE: 109 mg/dL — AB (ref 65–99)
Potassium: 4.1 mmol/L (ref 3.5–5.1)
Sodium: 137 mmol/L (ref 135–145)

## 2016-01-20 LAB — CBC
HEMATOCRIT: 46.5 % (ref 39.0–52.0)
HEMOGLOBIN: 15.3 g/dL (ref 13.0–17.0)
MCH: 30.6 pg (ref 26.0–34.0)
MCHC: 32.9 g/dL (ref 30.0–36.0)
MCV: 93 fL (ref 78.0–100.0)
Platelets: 114 10*3/uL — ABNORMAL LOW (ref 150–400)
RBC: 5 MIL/uL (ref 4.22–5.81)
RDW: 15.6 % — ABNORMAL HIGH (ref 11.5–15.5)
WBC: 10.9 10*3/uL — ABNORMAL HIGH (ref 4.0–10.5)

## 2016-01-20 LAB — SURGICAL PCR SCREEN
MRSA, PCR: NEGATIVE
Staphylococcus aureus: NEGATIVE

## 2016-01-20 NOTE — Pre-Procedure Instructions (Signed)
LEVENT OLVER  01/20/2016      [facility] [place] [address] Phone: [phone] Fax: [phone]    Your procedure is scheduled on  Tuesday  01/27/16  Report to [facility] Admitting at 600 A.M.  Call this number if you have problems the morning of surgery:  [phone]   Remember:  Do not eat food or drink liquids after midnight.  Take these medicines the morning of surgery with A SIP OF WATER   HYDROCODONE IF NEEDED  (STOP NOW ASPIRIN, ANY ASPIRIN PRODUCT, IBUPROFEN/ MOTRIN/ ADVIL/ ALEVE, GOODY POWDERS, BC'S ,HERBAL MEDICINES)   Do not wear jewelry, make-up or nail polish.  Do not wear lotions, powders, or perfumes, or deoderant.  Do not shave 48 hours prior to surgery.  Men may shave face and neck.  Do not bring valuables to the hospital.  [facility] is not responsible for any belongings or valuables.  Contacts, dentures or bridgework may not be worn into surgery.  Leave your suitcase in the car.  After surgery it may be brought to your room.  For patients admitted to the hospital, discharge time will be determined by your treatment team.  Patients discharged the day of surgery will not be allowed to drive home.   Name and phone number of your driver:    Special instructions:  [place] - Preparing for Surgery  Before surgery, you can play an important role.  Because skin is not sterile, your skin needs to be as free of germs as possible.  You can reduce the number of germs on you skin by washing with CHG (chlorahexidine gluconate) soap before surgery.  CHG is an antiseptic cleaner which kills germs and bonds with the skin to continue killing germs even after washing.  Please DO NOT use if you have an allergy to CHG or antibacterial soaps.  If your skin becomes reddened/irritated stop using the CHG and inform your nurse when you arrive at Short Stay.  Do not shave (including  legs and underarms) for at least 48 hours prior to the first CHG shower.  You may shave your face.  Please follow these instructions carefully:   1.  Shower with CHG Soap the night before surgery and the                                morning of Surgery.  2.  If you choose to wash your hair, wash your hair first as usual with your       normal shampoo.  3.  After you shampoo, rinse your hair and body thoroughly to remove the                      Shampoo.  4.  Use CHG as you would any other liquid soap.  You can apply chg directly       to the skin and wash gently with scrungie or a clean washcloth.  5.  Apply the CHG Soap to your body ONLY FROM THE NECK DOWN.        Do not use on open wounds or open sores.  Avoid contact with your eyes,       ears, mouth and genitals (private parts).  Wash genitals (private parts)       with your normal soap.  6.  Wash thoroughly, paying special attention to the area where your surgery        will be performed.  7.  Thoroughly rinse your body with warm water from the neck down.  8.  DO NOT shower/wash with your normal soap after using and rinsing off       the CHG Soap.  9.  Pat yourself dry with a clean towel.            10.  Wear clean pajamas.            11.  Place clean sheets on your bed the night of your first shower and do not        sleep with pets.  Day of Surgery  Do not apply any lotions/deoderants the morning of surgery.  Please wear clean clothes to the hospital/surgery center.    Please read over the following fact sheets that you were given. Pain Booklet, MRSA Information and Surgical Site Infection Prevention

## 2016-01-20 NOTE — Progress Notes (Signed)
PATIENT HAS SEEN DR. ALLRED IN PAST.  HAD CARDIAC CATH 2014+ ECHO IN [facility].  PCP IS DR. [facility] INTERNAL MEDICINE.  PATIENT STATED HE CANNOT USE CPAP, IT LEAKS.  TEST WAS DONE IN [place] 10 YEARS OR SO AGO.

## 2016-01-23 DIAGNOSIS — H547 Unspecified visual loss: Secondary | ICD-10-CM | POA: Diagnosis not present

## 2016-01-23 DIAGNOSIS — H9190 Unspecified hearing loss, unspecified ear: Secondary | ICD-10-CM | POA: Diagnosis not present

## 2016-01-26 NOTE — Anesthesia Preprocedure Evaluation (Addendum)
Anesthesia Evaluation  Patient identified by MRN, date of birth, ID band Patient awake    Reviewed: Allergy & Precautions, H&P , NPO status , Patient's Chart, lab work & pertinent test results, reviewed documented beta blocker date and time   Airway Mallampati: III  TM Distance: >3 FB Neck ROM: Full    Dental no notable dental hx. (+) Teeth Intact, Dental Advisory Given   Pulmonary sleep apnea , former smoker,    Pulmonary exam normal breath sounds clear to auscultation       Cardiovascular hypertension, Pt. on medications and Pt. on home beta blockers  Rhythm:Regular Rate:Normal     Neuro/Psych CVA negative psych ROS   GI/Hepatic Neg liver ROS, GERD  Medicated and Controlled,  Endo/Other  negative endocrine ROS  Renal/GU negative Renal ROS  negative genitourinary   Musculoskeletal  (+) Arthritis , Osteoarthritis,    Abdominal   Peds  Hematology negative hematology ROS (+)   Anesthesia Other Findings   Reproductive/Obstetrics negative OB ROS                            Anesthesia Physical Anesthesia Plan  ASA: III  Anesthesia Plan: General   Post-op Pain Management:    Induction: Intravenous  Airway Management Planned: Oral ETT  Additional Equipment:   Intra-op Plan:   Post-operative Plan: Extubation in OR  Informed Consent: I have reviewed the patients History and Physical, chart, labs and discussed the procedure including the risks, benefits and alternatives for the proposed anesthesia with the patient or authorized representative who has indicated his/her understanding and acceptance.   Dental advisory given  Plan Discussed with: CRNA  Anesthesia Plan Comments:         Anesthesia Quick Evaluation

## 2016-01-27 ENCOUNTER — Encounter (HOSPITAL_COMMUNITY): Payer: Self-pay | Admitting: Certified Registered Nurse Anesthetist

## 2016-01-27 ENCOUNTER — Inpatient Hospital Stay (HOSPITAL_COMMUNITY): Payer: Medicare Other

## 2016-01-27 ENCOUNTER — Encounter (HOSPITAL_COMMUNITY)
Admission: RE | Disposition: A | Discharge status: Home / Self Care | Payer: Self-pay | Source: Ambulatory Visit | Attending: Neurosurgery

## 2016-01-27 ENCOUNTER — Inpatient Hospital Stay (HOSPITAL_COMMUNITY): Payer: Medicare Other | Admitting: Vascular Surgery

## 2016-01-27 ENCOUNTER — Inpatient Hospital Stay (HOSPITAL_COMMUNITY): Payer: Medicare Other | Admitting: Anesthesiology

## 2016-01-27 ENCOUNTER — Inpatient Hospital Stay (HOSPITAL_COMMUNITY)
Admission: RE | Admit: 2016-01-27 | Discharge: 2016-01-29 | DRG: 517 | Disposition: A | Discharge status: Home / Self Care | Payer: Medicare Other | Source: Ambulatory Visit | Attending: Neurosurgery | Admitting: Neurosurgery

## 2016-01-27 DIAGNOSIS — G4733 Obstructive sleep apnea (adult) (pediatric): Secondary | ICD-10-CM | POA: Diagnosis present

## 2016-01-27 DIAGNOSIS — Z9889 Other specified postprocedural states: Secondary | ICD-10-CM | POA: Diagnosis not present

## 2016-01-27 DIAGNOSIS — E785 Hyperlipidemia, unspecified: Secondary | ICD-10-CM | POA: Diagnosis present

## 2016-01-27 DIAGNOSIS — M5417 Radiculopathy, lumbosacral region: Secondary | ICD-10-CM | POA: Diagnosis not present

## 2016-01-27 DIAGNOSIS — Z79899 Other long term (current) drug therapy: Secondary | ICD-10-CM | POA: Diagnosis not present

## 2016-01-27 DIAGNOSIS — M4806 Spinal stenosis, lumbar region: Secondary | ICD-10-CM | POA: Diagnosis present

## 2016-01-27 DIAGNOSIS — K219 Gastro-esophageal reflux disease without esophagitis: Secondary | ICD-10-CM | POA: Diagnosis present

## 2016-01-27 DIAGNOSIS — M48061 Spinal stenosis, lumbar region without neurogenic claudication: Secondary | ICD-10-CM | POA: Diagnosis present

## 2016-01-27 DIAGNOSIS — Z87442 Personal history of urinary calculi: Secondary | ICD-10-CM

## 2016-01-27 DIAGNOSIS — Z7982 Long term (current) use of aspirin: Secondary | ICD-10-CM | POA: Diagnosis not present

## 2016-01-27 DIAGNOSIS — Z87891 Personal history of nicotine dependence: Secondary | ICD-10-CM | POA: Diagnosis not present

## 2016-01-27 DIAGNOSIS — Z85828 Personal history of other malignant neoplasm of skin: Secondary | ICD-10-CM

## 2016-01-27 DIAGNOSIS — Z419 Encounter for procedure for purposes other than remedying health state, unspecified: Secondary | ICD-10-CM

## 2016-01-27 DIAGNOSIS — M109 Gout, unspecified: Secondary | ICD-10-CM | POA: Diagnosis present

## 2016-01-27 DIAGNOSIS — Z888 Allergy status to other drugs, medicaments and biological substances status: Secondary | ICD-10-CM | POA: Diagnosis not present

## 2016-01-27 DIAGNOSIS — Z8673 Personal history of transient ischemic attack (TIA), and cerebral infarction without residual deficits: Secondary | ICD-10-CM | POA: Diagnosis not present

## 2016-01-27 DIAGNOSIS — M79605 Pain in left leg: Secondary | ICD-10-CM | POA: Diagnosis not present

## 2016-01-27 DIAGNOSIS — I10 Essential (primary) hypertension: Secondary | ICD-10-CM | POA: Diagnosis present

## 2016-01-27 DIAGNOSIS — M4807 Spinal stenosis, lumbosacral region: Secondary | ICD-10-CM | POA: Diagnosis not present

## 2016-01-27 DIAGNOSIS — M5416 Radiculopathy, lumbar region: Secondary | ICD-10-CM | POA: Diagnosis not present

## 2016-01-27 DIAGNOSIS — Z91041 Radiographic dye allergy status: Secondary | ICD-10-CM

## 2016-01-27 HISTORY — PX: LUMBAR LAMINECTOMY/DECOMPRESSION MICRODISCECTOMY: SHX5026

## 2016-01-27 SURGERY — LUMBAR LAMINECTOMY/DECOMPRESSION MICRODISCECTOMY 2 LEVELS
Anesthesia: General | Site: Spine Lumbar | Laterality: Left

## 2016-01-27 MED ORDER — CYCLOBENZAPRINE HCL 10 MG PO TABS
ORAL_TABLET | ORAL | Status: AC
  Filled 2016-01-27: qty 1

## 2016-01-27 MED ORDER — CHLORHEXIDINE GLUCONATE CLOTH 2 % EX PADS: 6.0000 | MEDICATED_PAD | Freq: Once | CUTANEOUS | Status: DC

## 2016-01-27 MED ORDER — ALBUMIN HUMAN 5 % IV SOLN
INTRAVENOUS | Status: DC | PRN
  Administered 2016-01-27: 11:00:00 via INTRAVENOUS

## 2016-01-27 MED ORDER — MENTHOL 3 MG MT LOZG
1.0000 | LOZENGE | OROMUCOSAL | Status: DC | PRN
Start: 2016-01-27 — End: 2016-01-29

## 2016-01-27 MED ORDER — DEXTROSE 5 % IV SOLN: INTRAVENOUS | Status: DC | PRN

## 2016-01-27 MED ORDER — OXYCODONE-ACETAMINOPHEN 5-325 MG PO TABS
ORAL_TABLET | ORAL | Status: AC
  Filled 2016-01-27: qty 2

## 2016-01-27 MED ORDER — NADOLOL 80 MG PO TABS
80.0000 mg | ORAL_TABLET | Freq: Every evening | ORAL | Status: DC
  Administered 2016-01-27: 80 mg via ORAL
  Filled 2016-01-27 (×3): qty 1

## 2016-01-27 MED ORDER — VANCOMYCIN HCL 1000 MG IV SOLR
INTRAVENOUS | Status: DC | PRN
  Administered 2016-01-27: 1000 mg via TOPICAL

## 2016-01-27 MED ORDER — EPHEDRINE SULFATE-NACL 50-0.9 MG/10ML-% IV SOSY
PREFILLED_SYRINGE | INTRAVENOUS | Status: DC | PRN
  Administered 2016-01-27 (×2): 5 mg via INTRAVENOUS

## 2016-01-27 MED ORDER — PROPOFOL 10 MG/ML IV BOLUS
INTRAVENOUS | Status: DC | PRN
  Administered 2016-01-27: 100 mg via INTRAVENOUS

## 2016-01-27 MED ORDER — FENTANYL CITRATE (PF) 100 MCG/2ML IJ SOLN
INTRAMUSCULAR | Status: DC | PRN
  Administered 2016-01-27: 25 ug via INTRAVENOUS
  Administered 2016-01-27: 100 ug via INTRAVENOUS
  Administered 2016-01-27 (×2): 25 ug via INTRAVENOUS

## 2016-01-27 MED ORDER — ACETAMINOPHEN 325 MG PO TABS: 650.0000 mg | ORAL_TABLET | ORAL | Status: DC | PRN

## 2016-01-27 MED ORDER — ASPIRIN EC 81 MG PO TBEC
81.0000 mg | DELAYED_RELEASE_TABLET | Freq: Every evening | ORAL | Status: DC
  Administered 2016-01-27 – 2016-01-28 (×2): 81 mg via ORAL
  Filled 2016-01-27 (×2): qty 1

## 2016-01-27 MED ORDER — LACTATED RINGERS IV SOLN
INTRAVENOUS | Status: DC
  Administered 2016-01-27 (×2): via INTRAVENOUS

## 2016-01-27 MED ORDER — OXYCODONE-ACETAMINOPHEN 5-325 MG PO TABS
1.0000 | ORAL_TABLET | ORAL | Status: DC | PRN
  Administered 2016-01-27 – 2016-01-29 (×9): 2 via ORAL
  Administered 2016-01-29: 1 via ORAL
  Filled 2016-01-27 (×6): qty 2
  Filled 2016-01-27: qty 1
  Filled 2016-01-27 (×2): qty 2

## 2016-01-27 MED ORDER — ONDANSETRON HCL 4 MG/2ML IJ SOLN: 4.0000 mg | INTRAMUSCULAR | Status: DC | PRN

## 2016-01-27 MED ORDER — THROMBIN 5000 UNITS EX SOLR
CUTANEOUS | Status: DC | PRN
  Administered 2016-01-27 (×2): 5000 [IU] via TOPICAL

## 2016-01-27 MED ORDER — LIDOCAINE 2% (20 MG/ML) 5 ML SYRINGE
INTRAMUSCULAR | Status: DC | PRN
  Administered 2016-01-27: 60 mg via INTRAVENOUS

## 2016-01-27 MED ORDER — ACETAMINOPHEN 650 MG RE SUPP: 650.0000 mg | RECTAL | Status: DC | PRN

## 2016-01-27 MED ORDER — SUGAMMADEX SODIUM 200 MG/2ML IV SOLN
INTRAVENOUS | Status: DC | PRN
  Administered 2016-01-27: 25 mg via INTRAVENOUS
  Administered 2016-01-27: 50 mg via INTRAVENOUS

## 2016-01-27 MED ORDER — MIDAZOLAM HCL 5 MG/5ML IJ SOLN
INTRAMUSCULAR | Status: DC | PRN
  Administered 2016-01-27: 1 mg via INTRAVENOUS

## 2016-01-27 MED ORDER — HYDROMORPHONE HCL 1 MG/ML IJ SOLN
INTRAMUSCULAR | Status: AC
  Filled 2016-01-27: qty 1

## 2016-01-27 MED ORDER — PHENOL 1.4 % MT LIQD: 1.0000 | OROMUCOSAL | Status: DC | PRN

## 2016-01-27 MED ORDER — MIDAZOLAM HCL 2 MG/2ML IJ SOLN
INTRAMUSCULAR | Status: AC
  Filled 2016-01-27: qty 2

## 2016-01-27 MED ORDER — ALLOPURINOL 300 MG PO TABS
300.0000 mg | ORAL_TABLET | Freq: Every evening | ORAL | Status: DC
  Administered 2016-01-27 – 2016-01-28 (×2): 300 mg via ORAL
  Filled 2016-01-27: qty 1
  Filled 2016-01-27 (×2): qty 3
  Filled 2016-01-27 (×2): qty 1

## 2016-01-27 MED ORDER — MORPHINE SULFATE (PF) 2 MG/ML IV SOLN: 1.0000 mg | INTRAVENOUS | Status: DC | PRN

## 2016-01-27 MED ORDER — ROCURONIUM BROMIDE 10 MG/ML (PF) SYRINGE
PREFILLED_SYRINGE | INTRAVENOUS | Status: DC | PRN
  Administered 2016-01-27: 5 mg via INTRAVENOUS
  Administered 2016-01-27: 50 mg via INTRAVENOUS
  Administered 2016-01-27: 5 mg via INTRAVENOUS

## 2016-01-27 MED ORDER — BUPIVACAINE-EPINEPHRINE (PF) 0.5% -1:200000 IJ SOLN
INTRAMUSCULAR | Status: DC | PRN
  Administered 2016-01-27: 10 mL

## 2016-01-27 MED ORDER — FENTANYL CITRATE (PF) 100 MCG/2ML IJ SOLN
INTRAMUSCULAR | Status: AC
  Filled 2016-01-27: qty 4

## 2016-01-27 MED ORDER — CEFAZOLIN SODIUM-DEXTROSE 2-4 GM/100ML-% IV SOLN
2.0000 g | INTRAVENOUS | Status: AC
  Administered 2016-01-27: 2 g via INTRAVENOUS

## 2016-01-27 MED ORDER — SODIUM CHLORIDE 0.9% FLUSH
3.0000 mL | Freq: Two times a day (BID) | INTRAVENOUS | Status: DC
  Administered 2016-01-27 – 2016-01-29 (×4): 3 mL via INTRAVENOUS

## 2016-01-27 MED ORDER — ONDANSETRON HCL 4 MG/2ML IJ SOLN
INTRAMUSCULAR | Status: DC | PRN
  Administered 2016-01-27: 4 mg via INTRAVENOUS

## 2016-01-27 MED ORDER — SENNA 8.6 MG PO TABS
1.0000 | ORAL_TABLET | Freq: Two times a day (BID) | ORAL | Status: DC
  Administered 2016-01-27 – 2016-01-29 (×5): 8.6 mg via ORAL
  Filled 2016-01-27 (×5): qty 1

## 2016-01-27 MED ORDER — CEFAZOLIN SODIUM-DEXTROSE 2-4 GM/100ML-% IV SOLN
INTRAVENOUS | Status: AC
  Filled 2016-01-27: qty 100

## 2016-01-27 MED ORDER — SODIUM CHLORIDE 0.9% FLUSH: 3.0000 mL | INTRAVENOUS | Status: DC | PRN

## 2016-01-27 MED ORDER — PHENYLEPHRINE 40 MCG/ML (10ML) SYRINGE FOR IV PUSH (FOR BLOOD PRESSURE SUPPORT)
PREFILLED_SYRINGE | INTRAVENOUS | Status: DC | PRN
  Administered 2016-01-27: 80 ug via INTRAVENOUS
  Administered 2016-01-27: 40 ug via INTRAVENOUS

## 2016-01-27 MED ORDER — DEXAMETHASONE SODIUM PHOSPHATE 10 MG/ML IJ SOLN
INTRAMUSCULAR | Status: DC | PRN
  Administered 2016-01-27: 8 mg via INTRAVENOUS

## 2016-01-27 MED ORDER — VANCOMYCIN HCL 1000 MG IV SOLR
INTRAVENOUS | Status: AC
  Filled 2016-01-27: qty 1000

## 2016-01-27 MED ORDER — PROPOFOL 10 MG/ML IV BOLUS
INTRAVENOUS | Status: AC
  Filled 2016-01-27: qty 60

## 2016-01-27 MED ORDER — HEMOSTATIC AGENTS (NO CHARGE) OPTIME
TOPICAL | Status: DC | PRN
  Administered 2016-01-27: 1 via TOPICAL

## 2016-01-27 MED ORDER — SODIUM CHLORIDE 0.9 % IV SOLN
INTRAVENOUS | Status: DC
  Administered 2016-01-27: 14:00:00 via INTRAVENOUS

## 2016-01-27 MED ORDER — HYDROMORPHONE HCL 1 MG/ML IJ SOLN
0.2500 mg | INTRAMUSCULAR | Status: DC | PRN
  Administered 2016-01-27 (×2): 0.5 mg via INTRAVENOUS

## 2016-01-27 MED ORDER — SODIUM CHLORIDE 0.9 % IV SOLN: 250.0000 mL | INTRAVENOUS | Status: DC

## 2016-01-27 MED ORDER — LORATADINE 10 MG PO TABS
10.0000 mg | ORAL_TABLET | Freq: Every day | ORAL | Status: DC
  Administered 2016-01-27 – 2016-01-29 (×3): 10 mg via ORAL
  Filled 2016-01-27 (×3): qty 1

## 2016-01-27 MED ORDER — CEFAZOLIN IN D5W 1 GM/50ML IV SOLN
1.0000 g | Freq: Three times a day (TID) | INTRAVENOUS | Status: AC
  Administered 2016-01-27 – 2016-01-28 (×2): 1 g via INTRAVENOUS
  Filled 2016-01-27 (×2): qty 50

## 2016-01-27 MED ORDER — CYCLOBENZAPRINE HCL 10 MG PO TABS
10.0000 mg | ORAL_TABLET | Freq: Three times a day (TID) | ORAL | Status: DC | PRN
  Administered 2016-01-27 – 2016-01-29 (×6): 10 mg via ORAL
  Filled 2016-01-27 (×5): qty 1

## 2016-01-27 MED ORDER — 0.9 % SODIUM CHLORIDE (POUR BTL) OPTIME
TOPICAL | Status: DC | PRN
  Administered 2016-01-27: 1000 mL

## 2016-01-27 SURGICAL SUPPLY — 54 items
APL SKNCLS STERI-STRIP NONHPOA (GAUZE/BANDAGES/DRESSINGS) ×1
BENZOIN TINCTURE PRP APPL 2/3 (GAUZE/BANDAGES/DRESSINGS) ×3 IMPLANT
BLADE CLIPPER SURG (BLADE) ×2 IMPLANT
BUR ACORN 6.0 (BURR) ×1 IMPLANT
BUR ACORN 6.0MM (BURR) ×1
BUR MATCHSTICK NEURO 3.0 LAGG (BURR) ×3 IMPLANT
CANISTER SUCT 3000ML PPV (MISCELLANEOUS) ×3 IMPLANT
CLOSURE WOUND 1/2 X4 (GAUZE/BANDAGES/DRESSINGS) ×1
DRAPE LAPAROTOMY 100X72X124 (DRAPES) ×3 IMPLANT
DRAPE MICROSCOPE LEICA (MISCELLANEOUS) ×3 IMPLANT
DRAPE POUCH INSTRU U-SHP 10X18 (DRAPES) ×3 IMPLANT
DRSG OPSITE POSTOP 4X6 (GAUZE/BANDAGES/DRESSINGS) ×2 IMPLANT
DURAPREP 26ML APPLICATOR (WOUND CARE) ×3 IMPLANT
ELECT BLADE 4.0 EZ CLEAN MEGAD (MISCELLANEOUS) ×3
ELECT REM PT RETURN 9FT ADLT (ELECTROSURGICAL) ×3
ELECTRODE BLDE 4.0 EZ CLN MEGD (MISCELLANEOUS) IMPLANT
ELECTRODE REM PT RTRN 9FT ADLT (ELECTROSURGICAL) ×1 IMPLANT
GAUZE SPONGE 4X4 12PLY STRL (GAUZE/BANDAGES/DRESSINGS) ×3 IMPLANT
GLOVE BIOGEL M 8.0 STRL (GLOVE) ×3 IMPLANT
GLOVE BIOGEL PI IND STRL 7.0 (GLOVE) IMPLANT
GLOVE BIOGEL PI IND STRL 7.5 (GLOVE) IMPLANT
GLOVE BIOGEL PI INDICATOR 7.0 (GLOVE) ×2
GLOVE BIOGEL PI INDICATOR 7.5 (GLOVE) ×4
GLOVE ECLIPSE 7.0 STRL STRAW (GLOVE) ×2 IMPLANT
GLOVE SS N UNI LF 6.5 STRL (GLOVE) ×6 IMPLANT
GOWN STRL REUS W/ TWL LRG LVL3 (GOWN DISPOSABLE) ×1 IMPLANT
GOWN STRL REUS W/ TWL XL LVL3 (GOWN DISPOSABLE) IMPLANT
GOWN STRL REUS W/TWL LRG LVL3 (GOWN DISPOSABLE) ×6
GOWN STRL REUS W/TWL XL LVL3 (GOWN DISPOSABLE) ×3
KIT BASIN OR (CUSTOM PROCEDURE TRAY) ×3 IMPLANT
KIT ROOM TURNOVER OR (KITS) ×3 IMPLANT
NDL HYPO 18GX1.5 BLUNT FILL (NEEDLE) IMPLANT
NDL HYPO 25X1 1.5 SAFETY (NEEDLE) IMPLANT
NDL SPNL 20GX3.5 QUINCKE YW (NEEDLE) IMPLANT
NEEDLE HYPO 18GX1.5 BLUNT FILL (NEEDLE) IMPLANT
NEEDLE HYPO 21X1.5 SAFETY (NEEDLE) ×3 IMPLANT
NEEDLE HYPO 25X1 1.5 SAFETY (NEEDLE) ×3 IMPLANT
NEEDLE SPNL 20GX3.5 QUINCKE YW (NEEDLE) ×3 IMPLANT
NS IRRIG 1000ML POUR BTL (IV SOLUTION) ×3 IMPLANT
PACK LAMINECTOMY NEURO (CUSTOM PROCEDURE TRAY) ×3 IMPLANT
PAD ARMBOARD 7.5X6 YLW CONV (MISCELLANEOUS) ×9 IMPLANT
PATTIES SURGICAL .5 X1 (DISPOSABLE) ×3 IMPLANT
RUBBERBAND STERILE (MISCELLANEOUS) ×6 IMPLANT
SPONGE SURGIFOAM ABS GEL SZ50 (HEMOSTASIS) ×3 IMPLANT
STAPLER VISISTAT 35W (STAPLE) ×2 IMPLANT
STRIP CLOSURE SKIN 1/2X4 (GAUZE/BANDAGES/DRESSINGS) ×2 IMPLANT
SUT VIC AB 0 CT1 18XCR BRD8 (SUTURE) ×1 IMPLANT
SUT VIC AB 0 CT1 8-18 (SUTURE) ×3
SUT VIC AB 2-0 CP2 18 (SUTURE) ×3 IMPLANT
SUT VIC AB 3-0 SH 8-18 (SUTURE) ×3 IMPLANT
SYR 5ML LL (SYRINGE) IMPLANT
TOWEL OR 17X24 6PK STRL BLUE (TOWEL DISPOSABLE) ×3 IMPLANT
TOWEL OR 17X26 10 PK STRL BLUE (TOWEL DISPOSABLE) ×3 IMPLANT
WATER STERILE IRR 1000ML POUR (IV SOLUTION) ×3 IMPLANT

## 2016-01-27 NOTE — H&P (Signed)
Alexander [name] is an 65 y.o. male.   Chief Complaint: left leg pain [id number] seen with a history of lumbar pain with radiation to the left leg for several months no better with conservative treatment. . Pain is worse with ambulating and now he is gaining weight. No pain in the right lrg  Past Medical History:  Diagnosis Date  . Chest pain    a. 10.2006 Cath: nl cors, EF 65%;  b. 12/2008 Negative myoview.  . DDD (degenerative disc disease), cervical    a. 08/2008 s/p R L4-5 diskectomy, foaminotomy, and lysis of adhesions.  Marland Kitchen GERD (gastroesophageal reflux disease)   . Gout   . History of kidney stones   . Hyperlipidemia   . Hypertension   . OSA (obstructive sleep apnea)    a. on cpap  NOT ABLE TO USE  . Palpitations    a. 11/2008 - nl holter monitor;  b. 04/2010 Echo: EF 50-55%, Gr 1 DD.  . Skin cancer   . Stroke [facility])    a. 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    Past Surgical History:  Procedure Laterality Date  . APPENDECTOMY    . BACK SURGERY    . CATARACT EXTRACTION     RIGHT  . FOOT SURGERY    . HEMORROIDECTOMY    . KNEE SURGERY    . LEFT HEART CATHETERIZATION WITH CORONARY ANGIOGRAM N/A 07/18/2012   Procedure: LEFT HEART CATHETERIZATION WITH CORONARY ANGIOGRAM;  Surgeon: Burnell Blanks, MD;  Location: [facility] CATH LAB;  Service: Cardiovascular;  Laterality: N/A;  . TONSILLECTOMY      History reviewed. No pertinent family history. Social History:  reports that he has quit smoking. He has never used smokeless tobacco. He reports that he does not drink alcohol or use drugs.  Allergies:  Allergies  Allergen Reactions  . Contrast Media [Iodinated Diagnostic Agents] Other (See Comments)    STROKE  . Gadolinium Other (See Comments)     Code: VOM, Desc: unusual reaction, patient was nauseous, lost consciousness, Onset Date: [id number]     Medications Prior to Admission  Medication Sig Dispense Refill  . allopurinol (ZYLOPRIM) 300 MG tablet Take 300 mg by mouth  every evening.    Marland Kitchen aspirin EC 81 MG tablet Take 81 mg by mouth every evening.    Marland Kitchen HYDROcodone-acetaminophen (NORCO/VICODIN) 5-325 MG tablet Take 1 tablet by mouth every 8 (eight) hours as needed.    . nadolol (CORGARD) 40 MG tablet Take 80 mg by mouth every evening.    Marland Kitchen omeprazole (PRILOSEC) 20 MG capsule Take 20 mg by mouth every evening.    . fexofenadine (ALLEGRA) 180 MG tablet Take 180 mg by mouth daily as needed (allergies).      No results found for this or any previous visit (from the past 48 hour(s)). No results found.  Review of Systems  Constitutional: Negative.   HENT: Negative.   Eyes: Negative.   Respiratory: Negative.   Cardiovascular: Negative.   Gastrointestinal: Positive for nausea.  Genitourinary: Negative.   Musculoskeletal: Negative.   Skin: Negative.   Neurological: Positive for sensory change and focal weakness.  Endo/Heme/Allergies: Negative.   Psychiatric/Behavioral: Negative.     Blood pressure 124/69, pulse 63, temperature 97.7 F (36.5 C), temperature source Oral, resp. rate 18, height 5\' 10"  (1.778 m), weight 109.1 kg (240 lb 9 oz), SpO2 98 %. Physical Exam hent, n,. Neck, nl, cv, nl .lungs, clear  Abdomen ,nl. extremities, nl  NEURO weakness of dorsiflexion  of left foot. 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 He is aware of risks and benefits  Floyce Stakes, MD 01/27/2016, 8:49 AM

## 2016-01-27 NOTE — Anesthesia Procedure Notes (Signed)
Procedure Name: Intubation Date/Time: 01/27/2016 9:48 AM Performed by: Merdis Delay Pre-anesthesia Checklist: Patient identified, Emergency Drugs available, Suction available, Patient being monitored and Timeout performed Patient Re-evaluated:Patient Re-evaluated prior to inductionOxygen Delivery Method: Circle system utilized Preoxygenation: Pre-oxygenation with 100% oxygen Intubation Type: IV induction Ventilation: Mask ventilation without difficulty and Oral airway inserted - appropriate to patient size Laryngoscope Size: Mac and 4 Grade View: Grade II Tube type: Oral Tube size: 7.5 mm Number of attempts: 1 Airway Equipment and Method: Stylet Placement Confirmation: ETT inserted through vocal cords under direct vision,  positive ETCO2,  CO2 detector and breath sounds checked- equal and bilateral Secured at: 22 cm Tube secured with: Tape Dental Injury: Teeth and Oropharynx as per pre-operative assessment

## 2016-01-27 NOTE — Transfer of Care (Signed)
Immediate Anesthesia Transfer of Care Note  Patient: Alexander [name]  Procedure(s) Performed: Procedure(s) with comments: Left Lumbar Four-Five, Lumbar FIve-Sacral One Laminectomy/Foraminotomy (Left) - Left L4-5 L5-S1 Laminectomy/Foraminotomy  Patient Location: PACU  Anesthesia Type:General  Level of Consciousness: awake, alert  and confused  Airway & Oxygen Therapy: Patient Spontanous Breathing and Patient connected to nasal cannula oxygen  Post-op Assessment: Report given to RN and Post -op Vital signs reviewed and stable  Post vital signs: Reviewed and stable  Last Vitals:  Vitals:   01/27/16 0708  BP: 124/69  Pulse: 63  Resp: 18  Temp: 36.5 C    Last Pain:  Vitals:   01/27/16 0731  TempSrc:   PainSc: 3          Complications: No apparent anesthesia complications

## 2016-01-27 NOTE — Evaluation (Signed)
Physical Therapy Evaluation Patient Details Name: Alexander [name] MRN: [id number] DOB: 07-28-1950 Today's Date: 01/27/2016   History of Present Illness  pt is a 65 y/o male with pmh of lumbar pain, ddd, HTN, OSA, and small stroke 12/11, admitted with padicular left leg pain, s/p L45, L5S1 laminectomies/foraminectomies.  Clinical Impression  Pt admitted with/for lumbar surgery.  Pt currently limited functionally due to the problems listed below.  (see problems list.)  Pt will benefit from PT to maximize function and safety to be able to get home safely with available assist of family.  Presently pt is generally at a min guard level. .     Follow Up Recommendations No PT follow up    Equipment Recommendations  None recommended by PT    Recommendations for Other Services       Precautions / Restrictions Precautions Precautions: Back      Mobility  Bed Mobility Overal bed mobility: Needs Assistance Bed Mobility: Sidelying to Sit;Rolling Rolling: Min guard Sidelying to sit: Min guard       General bed mobility comments: Reinforced/demo'd bed mobility techniques  Transfers Overall transfer level: Needs assistance Equipment used: Rolling walker (2 wheeled) Transfers: Sit to/from Stand Sit to Stand: Min guard         General transfer comment: cues for hand placement  Ambulation/Gait Ambulation/Gait assistance: Min guard Ambulation Distance (Feet): 180 Feet Assistive device:  (iv pole) Gait Pattern/deviations: Step-through pattern Gait velocity: slower Gait velocity interpretation: Below normal speed for age/gender General Gait Details: generally steady, but with a few episodes of instability  Stairs            Wheelchair Mobility    Modified Rankin (Stroke Patients Only)       Balance Overall balance assessment: No apparent balance deficits (not formally assessed)                                           Pertinent Vitals/Pain Pain  Assessment: Faces Faces Pain Scale: Hurts little more Pain Location: incisional Pain Descriptors / Indicators: Sore Pain Intervention(s): Monitored during session;Repositioned    Home Living Family/patient expects to be discharged to:: Private residence Living Arrangements: Spouse/significant other Available Help at Discharge: Family Type of Home: House Home Access: Stairs to enter Entrance Stairs-Rails: Psychiatric nurse of Steps: several Home Layout: Two level;Laundry or work area in [place]: Environmental consultant - 2 wheels;Bedside commode (?bsc)      Prior Function Level of Independence: Independent               Hand Dominance        Extremity/Trunk Assessment   Upper Extremity Assessment: Defer to OT evaluation           Lower Extremity Assessment: Overall WFL for tasks assessed         Communication   Communication: HOH  Cognition Arousal/Alertness: Awake/alert Behavior During Therapy: WFL for tasks assessed/performed Overall Cognitive Status: Within Functional Limits for tasks assessed                      General Comments General comments (skin integrity, edema, etc.): pt instructed in advised back care/prec, lifting restrictions and progression of activity.    Exercises        Assessment/Plan    PT Assessment Patient needs continued PT services  PT Diagnosis Acute pain   PT  Problem List Decreased strength;Decreased activity tolerance;Decreased mobility;Decreased knowledge of precautions;Pain  PT Treatment Interventions DME instruction;Gait training;Stair training;Functional mobility training;Therapeutic activities;Patient/family education   PT Goals (Current goals can be found in the Care Plan section) Acute Rehab PT Goals Patient Stated Goal: home independent PT Goal Formulation: With patient Time For Goal Achievement: 02/03/16 Potential to Achieve Goals: Good    Frequency Min 5X/week   Barriers to  discharge        Co-evaluation               End of Session   Activity Tolerance: Patient tolerated treatment well Patient left: in chair;with call bell/phone within reach;with family/visitor present Nurse Communication: Mobility status         Time: 1704-1730 PT Time Calculation (min) (ACUTE ONLY): 26 min   Charges:   PT Evaluation $PT Eval Low Complexity: 1 Procedure PT Treatments $Gait Training: 8-22 mins   PT G Codes:        Loisann Roach, Tessie Fass 01/27/2016, 6:09 PM 01/27/2016  Donnella Sham, PT [phone]  (pager)

## 2016-01-27 NOTE — Anesthesia Postprocedure Evaluation (Signed)
Anesthesia Post Note  Patient: [name]  Procedure(s) Performed: Procedure(s) (LRB): Left Lumbar Four-Five, Lumbar FIve-Sacral One Laminectomy/Foraminotomy (Left)  Patient location during evaluation: PACU Anesthesia Type: General Level of consciousness: awake and alert Pain management: pain level controlled Vital Signs Assessment: post-procedure vital signs reviewed and stable Respiratory status: spontaneous breathing, nonlabored ventilation, respiratory function stable and patient connected to nasal cannula oxygen Cardiovascular status: blood pressure returned to baseline and stable Postop Assessment: no signs of nausea or vomiting Anesthetic complications: no    Last Vitals:  Vitals:   01/27/16 1245 01/27/16 1300  BP: (!) 126/58 (!) 123/57  Pulse: 67 70  Resp: (!) 9 10  Temp: 36.6 C     Last Pain:  Vitals:   01/27/16 1245  TempSrc:   PainSc: Asleep                 Artelia Game,W. EDMOND

## 2016-01-28 ENCOUNTER — Encounter (HOSPITAL_COMMUNITY): Payer: Self-pay | Admitting: Neurosurgery

## 2016-01-28 NOTE — Op Note (Signed)
Alexander [name], Alexander [name]                   ACCOUNT NO.:  [id number]  MEDICAL RECORD NO.:  [id number]  LOCATION:  5C02C                        FACILITY:  [facility]  PHYSICIAN:  Leeroy Cha, M.D.   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  Status post previous diskectomy about 20+ years ago.  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  Status post previous diskectomy about 20+ years ago.  PROCEDURES:  Left L4-L5 hemilaminectomy.  Foraminotomy to decompress the L4, L5 and S1 nerve root.  Lysis of adhesion.  Microscope.  SURGEON:  Leeroy Cha, M.D.  ASSISTANT:  Dr. Kathyrn Sheriff.  CLINICAL HISTORY:  [name] is a gentleman, who in the past underwent surgery mostly in the right side.  Now, he is back complaining of back pain radiation to the left leg, no better with conservative treatment including epidural injection.  Myelogram showed that he has a stenosis bilaterally at the level of 4-5 and 5-1.  Surgery was advised.  The procedure was in the left side.  The patient has no pain in the right leg.  He knew the risk and benefit of the surgery including no improvement and need for further surgery including fusion.  DESCRIPTION OF PROCEDURE:  The patient was taken to the OR, and after intubation, he was positioned in a prone manner.  The back was cleaned with DuraPrep and drapes were applied.  Midline incision following the previous one was made through the skin, subcutaneous tissue, fibrotic tissue from previous surgery.  Decompression was made laterally.  X-rays showed we were right at the level of 4-5.  From then on, with the help of the microscope and the drill, we started removing part of the lamina of L4 above and then, we did a hemilaminectomy of L5.  The patient  had quite a bit of adhesion and lysis was accomplished decompressing the thecal sac.  From then on, we identified the L4 space.  We decompressed the L4 nerve root.  Then, we followed up decompressing the L5 nerve root, which was quite tight using the drill, the 1 and 2 and 3-mm Kerrison punch as well as the S1 nerve root.  We went a little higher up and we were able to see really well the part of the L3-4 space.  Then, the area was quite open. At the end, we had plenty of space for the L4, L5 and S1 nerve root. The area was irrigated.  Valsalva maneuver was negative.  Then, vancomycin powder was left in the operative site and the wound was closed with Vicryl and staples.  A drain was left in the operative site.          ______________________________ Leeroy Cha, M.D.     EB/MEDQ  D:  01/27/2016  T:  01/28/2016  Job:  [id number]

## 2016-01-28 NOTE — Care Management Note (Signed)
Case Management Note  Patient Details  Name: Alexander [name] MRN: [id number] Date of Birth: Feb 25, 1951  Subjective/Objective:  Pt underwent:   Left Lumbar Four-Five, Lumbar FIve-Sacral One Laminectomy/Foraminotomy. He is from home with his spouse.                 Action/Plan: No f/u per PT. CM following for d/c needs.   Expected Discharge Date:   (Pending)               Expected Discharge Plan:  Home/Self Care  In-House Referral:     Discharge planning Services     Post Acute Care Choice:    Choice offered to:     DME Arranged:    DME Agency:     HH Arranged:    HH Agency:     Status of Service:  In process, will continue to follow  If discussed at Long Length of Stay Meetings, dates discussed:    Additional Comments:  Pollie Friar, RN 01/28/2016, 1:28 PM

## 2016-01-28 NOTE — Progress Notes (Signed)
Patient ID: Alexander [name], male   DOB: 05-11-1951, 65 y.o.   MRN: [id number] Stable,no pain or weakness as preop. To remove drain.home tonite or in am

## 2016-01-28 NOTE — Progress Notes (Signed)
Physical Therapy Treatment Patient Details Name: Alexander [name] MRN: [id number] DOB: 29-Dec-1950 Today's Date: 01/28/2016    History of Present Illness pt is a 65 y/o male with pmh of lumbar pain, ddd, HTN, OSA, and small stroke 12/11, admitted with padicular left leg pain, s/p L45, L5S1 laminectomies/foraminectomies.    PT Comments    Progressing well.  Mobility becoming more steady and smooth.  Reviewed education, pt verbalized understanding.  Follow Up Recommendations  No PT follow up     Equipment Recommendations  None recommended by PT    Recommendations for Other Services       Precautions / Restrictions Precautions Precautions: Back Restrictions Weight Bearing Restrictions: No    Mobility  Bed Mobility Overal bed mobility: Modified Independent             General bed mobility comments: Reinforced best technque  Transfers Overall transfer level: Needs assistance   Transfers: Sit to/from Stand Sit to Stand: Supervision            Ambulation/Gait Ambulation/Gait assistance: Supervision Ambulation Distance (Feet): 600 Feet Assistive device: None Gait Pattern/deviations: Step-through pattern Gait velocity: functional Gait velocity interpretation: at or above normal speed for age/gender General Gait Details: steady with discernable limp/?fatigue as distance became longer   Stairs Stairs: Yes Stairs assistance: Supervision Stair Management: One rail Left;Alternating pattern;Forwards Number of Stairs: 12 General stair comments: steady and smooth with rail  Wheelchair Mobility    Modified Rankin (Stroke Patients Only)       Balance Overall balance assessment: Needs assistance   Sitting balance-Leahy Scale: Good     Standing balance support: No upper extremity supported Standing balance-Leahy Scale: Good                      Cognition Arousal/Alertness: Awake/alert Behavior During Therapy: WFL for tasks assessed/performed Overall  Cognitive Status: Within Functional Limits for tasks assessed                      Exercises      General Comments General comments (skin integrity, edema, etc.): Reviewed (advised) back care/prec, lifting restrictions, and progression of activity.      Pertinent Vitals/Pain Pain Assessment: Faces Pain Location: left hip/thigh Pain Descriptors / Indicators: Sore Pain Intervention(s): Monitored during session;Repositioned    Home Living                      Prior Function            PT Goals (current goals can now be found in the care plan section) Acute Rehab PT Goals Patient Stated Goal: home independent PT Goal Formulation: With patient Time For Goal Achievement: 02/03/16 Potential to Achieve Goals: Good Progress towards PT goals: Progressing toward goals    Frequency  Min 5X/week    PT Plan Current plan remains appropriate    Co-evaluation             End of Session   Activity Tolerance: Patient tolerated treatment well Patient left: in bed;with call bell/phone within reach     Time: 0954-1009 PT Time Calculation (min) (ACUTE ONLY): 15 min  Charges:  $Gait Training: 8-22 mins                    G Codes:      Alexander [name], Alexander [name] 01/28/2016, 10:16 AM 01/28/2016  Alexander [name], PT [phone]  (pager)

## 2016-01-28 NOTE — Progress Notes (Signed)
BP 121/38; pt asymptomatic.  Per flowsheet, pt has been running a low diastolic.  States he is usually in the 70's or 80's at home.  Dr. Christella Noa notified.  Pt alert and oriented, denies any issues other than back discomfort from surgery.  Good pedal pulses.  Dressing with stain mark. Will continue to monitor closely.

## 2016-01-29 NOTE — Care Management Note (Signed)
Case Management Note  Patient Details  Name: Alexander [name] MRN: [id number] Date of Birth: December 21, 1950  Subjective/Objective:                    Action/Plan: Pt discharging home with self care. No further needs per CM.   Expected Discharge Date:   (Pending)               Expected Discharge Plan:  Home/Self Care  In-House Referral:     Discharge planning Services     Post Acute Care Choice:    Choice offered to:     DME Arranged:    DME Agency:     HH Arranged:    [place] Agency:     Status of Service:  Completed, signed off  If discussed at [employer] of Stay Meetings, dates discussed:    Additional Comments:  Pollie Friar, RN 01/29/2016, 10:03 AM

## 2016-01-29 NOTE — Progress Notes (Signed)
Physical Therapy Treatment Patient Details Name: Alexander [name] MRN: [id number] DOB: [birth year] Today's Date: 01/29/2016    History of Present Illness pt is a 65 y/o male with pmh of lumbar pain, ddd, HTN, OSA, and small stroke 12/11, admitted with padicular left leg pain, s/p L45, L5S1 laminectomies/foraminectomies.    PT Comments    Pt mobilizing well.  Education completed.  Ready for d/c without need for follow up.  Follow Up Recommendations  No PT follow up     Equipment Recommendations  None recommended by PT    Recommendations for Other Services       Precautions / Restrictions Precautions Precautions: Back Precaution Comments: handout provided and reviewed    Mobility  Bed Mobility Overal bed mobility: Modified Independent                Transfers Overall transfer level: Modified independent                  Ambulation/Gait Ambulation/Gait assistance: Modified independent (Device/Increase time)     Gait Pattern/deviations: Step-through pattern Gait velocity: functional Gait velocity interpretation: at or above normal speed for age/gender General Gait Details: steady and safe, but with minimal ability to change speed on cue   Stairs            Wheelchair Mobility    Modified Rankin (Stroke Patients Only)       Balance Overall balance assessment: Needs assistance   Sitting balance-Leahy Scale: Normal       Standing balance-Leahy Scale: Good                      Cognition Arousal/Alertness: Awake/alert Behavior During Therapy: WFL for tasks assessed/performed Overall Cognitive Status: Within Functional Limits for tasks assessed                      Exercises      General Comments General comments (skin integrity, edema, etc.): reviewed all education.  Presented as good life changes.      Pertinent Vitals/Pain Pain Assessment: Faces Faces Pain Scale: Hurts little more Pain Location: all over Pain  Descriptors / Indicators: Sore Pain Intervention(s): Monitored during session;Repositioned    Home Living                      Prior Function            PT Goals (current goals can now be found in the care plan section) Acute Rehab PT Goals Patient Stated Goal: home independent PT Goal Formulation: With patient Time For Goal Achievement: 02/03/16 Potential to Achieve Goals: Good Progress towards PT goals: Progressing toward goals    Frequency  Min 5X/week    PT Plan Current plan remains appropriate    Co-evaluation             End of Session   Activity Tolerance: Patient tolerated treatment well Patient left: in chair;with call bell/phone within reach     Time: 0945-1005 PT Time Calculation (min) (ACUTE ONLY): 20 min  Charges:  $Gait Training: 8-22 mins                    G Codes:      Sam Wunschel, Tessie Fass 01/29/2016, 10:09 AM 01/29/2016  Donnella Sham, PT [phone]  (pager)

## 2016-01-29 NOTE — Progress Notes (Signed)
Occupational Therapy Treatment Patient Details Name: GLADSTONE ROSAS MRN: [id number] DOB: 1950-08-21 Today's Date: 01/29/2016    History of present illness pt is a 65 y/o male with pmh of lumbar pain, ddd, HTN, OSA, and small stroke 12/11, admitted with padicular left leg pain, s/p L45, L5S1 laminectomies/foraminectomies.   OT comments  Completed all education regarding back precautions and compensatory techniques with use of AE and DME. Pt demonstrated understanding. Pt safe to D/C home when medically stable. If wife has questions about ADL and back precautions, please page number below. Thanks  Follow Up Recommendations  No OT follow up    Equipment Recommendations  None recommended by OT    Recommendations for Other Services      Precautions / Restrictions Precautions Precautions: Back Precaution Booklet Issued: Yes (comment) Precaution Comments: handout provided and reviewed Restrictions Weight Bearing Restrictions: No       Mobility Bed Mobility Overal bed mobility: Modified Independent                Transfers Overall transfer level: Modified independent                    Balance Overall balance assessment: No apparent balance deficits (not formally assessed)   Sitting balance-Leahy Scale: Normal       Standing balance-Leahy Scale: Good                     ADL                                         General ADL Comments: comleted educaiton regarding comepsnatory techniques for ADL with use of AE. Pt demonstrated understanding. Also educatied pt on home saety and reducing risk of falls.  Pt able to complete LB ADL with use of AE. Pt states either his wife will help or he will purchase the AE.                                      Cognition   Behavior During Therapy: WFL for tasks assessed/performed Overall Cognitive Status: Within Functional Limits for tasks assessed                                                 General Comments      Pertinent Vitals/ Pain       Pain Assessment: Faces Faces Pain Scale: Hurts a little bit Pain Location: back Pain Descriptors / Indicators: Sore Pain Intervention(s): Limited activity within patient's tolerance  Home Living                                          Prior Functioning/Environment              Frequency       Progress Toward Goals  OT Goals(current goals can now be found in the care plan section)  Progress towards OT goals: Goals met/education completed, patient discharged from OT  Acute Rehab OT Goals Patient Stated Goal: home independent OT Goal Formulation: All assessment and education complete,  DC therapy ADL Goals Pt Will Perform Grooming: with set-up;standing Pt Will Perform Upper Body Bathing: with supervision;standing Pt Will Transfer to Toilet: with supervision;bedside commode Additional ADL Goal #1: Pt will verbalize back precautions 3 out 3 as precursor to adls  Plan All goals met and education completed, patient discharged from OT services    Co-evaluation                 End of Session     Activity Tolerance Patient tolerated treatment well   Patient Left in chair;with call bell/phone within reach   Nurse Communication Mobility status        Time: 0037-0488 OT Time Calculation (min): 17 min  Charges: OT General Charges $OT Visit: 1 Procedure OT Treatments $Self Care/Home Management : 8-22 mins  Lyon Dumont,HILLARY 01/29/2016, 11:01 AM   Maurie Boettcher, OTR/L  [phone] 01/29/2016

## 2016-01-29 NOTE — Discharge Summary (Signed)
Physician Discharge Summary  Patient ID: Alexander [name] MRN: [id number] DOB/AGE: Oct 22, 1950 65 y.o.  Admit date: 01/27/2016 Discharge date: 01/29/2016  Admission Diagnoses:lumbar stenosis  Discharge Diagnoses:  Active Problems:   Lumbar stenosis   Discharged Condition: no pain or weakness as preop  Hospital Course: surgery  Consults: none  Significant Diagnostic Studies: myelogram  Treatments: left l45.l5s1 laminectomies and foraminotomies  Discharge Exam: Blood pressure 122/61, pulse 66, temperature 98 F (36.7 C), temperature source Oral, resp. rate 20, height 5\' 10"  (1.778 m), weight 109.1 kg (240 lb 9 oz), SpO2 99 %. Ambulating, no weakness  Disposition: 01-Home or Self Care      Signed: Earl Losee M 01/29/2016, 9:40 AM

## 2016-01-29 NOTE — Progress Notes (Signed)
Patient is discharged from room 5C02 at this time. Alert and in stable condition. IV site d/c'd. Dressing to back incision changed. Instructions read to patient with understanding verbalized. Left unit via wheelchair with family and all belongings at side.

## 2016-02-20 DIAGNOSIS — R509 Fever, unspecified: Secondary | ICD-10-CM | POA: Diagnosis not present

## 2016-02-20 DIAGNOSIS — Z6832 Body mass index (BMI) 32.0-32.9, adult: Secondary | ICD-10-CM | POA: Diagnosis not present

## 2016-02-20 DIAGNOSIS — N419 Inflammatory disease of prostate, unspecified: Secondary | ICD-10-CM | POA: Diagnosis not present

## 2016-02-20 DIAGNOSIS — I10 Essential (primary) hypertension: Secondary | ICD-10-CM | POA: Diagnosis not present

## 2016-02-20 DIAGNOSIS — R350 Frequency of micturition: Secondary | ICD-10-CM | POA: Diagnosis not present

## 2016-02-27 DIAGNOSIS — H353131 Nonexudative age-related macular degeneration, bilateral, early dry stage: Secondary | ICD-10-CM | POA: Diagnosis not present

## 2016-02-27 DIAGNOSIS — H2512 Age-related nuclear cataract, left eye: Secondary | ICD-10-CM | POA: Diagnosis not present

## 2016-03-02 DIAGNOSIS — Z6833 Body mass index (BMI) 33.0-33.9, adult: Secondary | ICD-10-CM | POA: Diagnosis not present

## 2016-03-02 DIAGNOSIS — I10 Essential (primary) hypertension: Secondary | ICD-10-CM | POA: Diagnosis not present

## 2016-03-02 DIAGNOSIS — Z713 Dietary counseling and surveillance: Secondary | ICD-10-CM | POA: Diagnosis not present

## 2016-03-02 DIAGNOSIS — Z299 Encounter for prophylactic measures, unspecified: Secondary | ICD-10-CM | POA: Diagnosis not present

## 2016-03-04 DIAGNOSIS — H2512 Age-related nuclear cataract, left eye: Secondary | ICD-10-CM | POA: Diagnosis not present

## 2016-03-17 DIAGNOSIS — I639 Cerebral infarction, unspecified: Secondary | ICD-10-CM | POA: Diagnosis not present

## 2016-03-17 DIAGNOSIS — I10 Essential (primary) hypertension: Secondary | ICD-10-CM | POA: Diagnosis not present

## 2016-03-17 DIAGNOSIS — M159 Polyosteoarthritis, unspecified: Secondary | ICD-10-CM | POA: Diagnosis not present

## 2016-03-30 DIAGNOSIS — M549 Dorsalgia, unspecified: Secondary | ICD-10-CM | POA: Diagnosis not present

## 2016-03-30 DIAGNOSIS — L821 Other seborrheic keratosis: Secondary | ICD-10-CM | POA: Diagnosis not present

## 2016-03-30 DIAGNOSIS — L570 Actinic keratosis: Secondary | ICD-10-CM | POA: Diagnosis not present

## 2016-03-30 DIAGNOSIS — Z6833 Body mass index (BMI) 33.0-33.9, adult: Secondary | ICD-10-CM | POA: Diagnosis not present

## 2016-03-30 DIAGNOSIS — I10 Essential (primary) hypertension: Secondary | ICD-10-CM | POA: Diagnosis not present

## 2016-03-30 DIAGNOSIS — L720 Epidermal cyst: Secondary | ICD-10-CM | POA: Diagnosis not present

## 2016-03-30 DIAGNOSIS — L905 Scar conditions and fibrosis of skin: Secondary | ICD-10-CM | POA: Diagnosis not present

## 2016-03-30 DIAGNOSIS — G4710 Hypersomnia, unspecified: Secondary | ICD-10-CM | POA: Diagnosis not present

## 2016-03-30 DIAGNOSIS — Z85828 Personal history of other malignant neoplasm of skin: Secondary | ICD-10-CM | POA: Diagnosis not present

## 2016-03-30 DIAGNOSIS — Z299 Encounter for prophylactic measures, unspecified: Secondary | ICD-10-CM | POA: Diagnosis not present

## 2016-04-05 DIAGNOSIS — G4710 Hypersomnia, unspecified: Secondary | ICD-10-CM | POA: Diagnosis not present

## 2016-04-06 DIAGNOSIS — G4730 Sleep apnea, unspecified: Secondary | ICD-10-CM | POA: Diagnosis not present

## 2016-04-13 DIAGNOSIS — D485 Neoplasm of uncertain behavior of skin: Secondary | ICD-10-CM | POA: Diagnosis not present

## 2016-04-21 DIAGNOSIS — M159 Polyosteoarthritis, unspecified: Secondary | ICD-10-CM | POA: Diagnosis not present

## 2016-04-21 DIAGNOSIS — I10 Essential (primary) hypertension: Secondary | ICD-10-CM | POA: Diagnosis not present

## 2016-04-21 DIAGNOSIS — I639 Cerebral infarction, unspecified: Secondary | ICD-10-CM | POA: Diagnosis not present

## 2016-05-19 DIAGNOSIS — I639 Cerebral infarction, unspecified: Secondary | ICD-10-CM | POA: Diagnosis not present

## 2016-05-19 DIAGNOSIS — I10 Essential (primary) hypertension: Secondary | ICD-10-CM | POA: Diagnosis not present

## 2016-05-19 DIAGNOSIS — M159 Polyosteoarthritis, unspecified: Secondary | ICD-10-CM | POA: Diagnosis not present

## 2016-05-20 DIAGNOSIS — Z1389 Encounter for screening for other disorder: Secondary | ICD-10-CM | POA: Diagnosis not present

## 2016-05-20 DIAGNOSIS — Z7189 Other specified counseling: Secondary | ICD-10-CM | POA: Diagnosis not present

## 2016-05-20 DIAGNOSIS — Z1211 Encounter for screening for malignant neoplasm of colon: Secondary | ICD-10-CM | POA: Diagnosis not present

## 2016-05-20 DIAGNOSIS — Z6835 Body mass index (BMI) 35.0-35.9, adult: Secondary | ICD-10-CM | POA: Diagnosis not present

## 2016-05-20 DIAGNOSIS — Z299 Encounter for prophylactic measures, unspecified: Secondary | ICD-10-CM | POA: Diagnosis not present

## 2016-05-20 DIAGNOSIS — Z0000 Encounter for general adult medical examination without abnormal findings: Secondary | ICD-10-CM | POA: Diagnosis not present

## 2016-07-12 DIAGNOSIS — I639 Cerebral infarction, unspecified: Secondary | ICD-10-CM | POA: Diagnosis not present

## 2016-07-12 DIAGNOSIS — I10 Essential (primary) hypertension: Secondary | ICD-10-CM | POA: Diagnosis not present

## 2016-07-12 DIAGNOSIS — M159 Polyosteoarthritis, unspecified: Secondary | ICD-10-CM | POA: Diagnosis not present

## 2016-07-16 DIAGNOSIS — M1A09X0 Idiopathic chronic gout, multiple sites, without tophus (tophi): Secondary | ICD-10-CM | POA: Diagnosis not present

## 2016-07-16 DIAGNOSIS — M1990 Unspecified osteoarthritis, unspecified site: Secondary | ICD-10-CM | POA: Diagnosis not present

## 2016-08-06 DIAGNOSIS — Z6835 Body mass index (BMI) 35.0-35.9, adult: Secondary | ICD-10-CM | POA: Diagnosis not present

## 2016-08-06 DIAGNOSIS — G4733 Obstructive sleep apnea (adult) (pediatric): Secondary | ICD-10-CM | POA: Diagnosis not present

## 2016-08-06 DIAGNOSIS — Z299 Encounter for prophylactic measures, unspecified: Secondary | ICD-10-CM | POA: Diagnosis not present

## 2016-08-06 DIAGNOSIS — I10 Essential (primary) hypertension: Secondary | ICD-10-CM | POA: Diagnosis not present

## 2016-08-06 DIAGNOSIS — G4710 Hypersomnia, unspecified: Secondary | ICD-10-CM | POA: Diagnosis not present

## 2016-08-06 DIAGNOSIS — I639 Cerebral infarction, unspecified: Secondary | ICD-10-CM | POA: Diagnosis not present

## 2016-08-06 DIAGNOSIS — Z87891 Personal history of nicotine dependence: Secondary | ICD-10-CM | POA: Diagnosis not present

## 2016-09-16 DIAGNOSIS — M159 Polyosteoarthritis, unspecified: Secondary | ICD-10-CM | POA: Diagnosis not present

## 2016-09-16 DIAGNOSIS — I10 Essential (primary) hypertension: Secondary | ICD-10-CM | POA: Diagnosis not present

## 2016-09-16 DIAGNOSIS — I639 Cerebral infarction, unspecified: Secondary | ICD-10-CM | POA: Diagnosis not present

## 2016-09-27 DIAGNOSIS — Z85828 Personal history of other malignant neoplasm of skin: Secondary | ICD-10-CM | POA: Diagnosis not present

## 2016-09-27 DIAGNOSIS — L570 Actinic keratosis: Secondary | ICD-10-CM | POA: Diagnosis not present

## 2016-09-27 DIAGNOSIS — L820 Inflamed seborrheic keratosis: Secondary | ICD-10-CM | POA: Diagnosis not present

## 2016-09-27 DIAGNOSIS — L905 Scar conditions and fibrosis of skin: Secondary | ICD-10-CM | POA: Diagnosis not present

## 2016-09-27 DIAGNOSIS — L821 Other seborrheic keratosis: Secondary | ICD-10-CM | POA: Diagnosis not present

## 2016-10-25 DIAGNOSIS — M159 Polyosteoarthritis, unspecified: Secondary | ICD-10-CM | POA: Diagnosis not present

## 2016-10-25 DIAGNOSIS — I10 Essential (primary) hypertension: Secondary | ICD-10-CM | POA: Diagnosis not present

## 2016-10-25 DIAGNOSIS — I639 Cerebral infarction, unspecified: Secondary | ICD-10-CM | POA: Diagnosis not present

## 2016-11-18 DIAGNOSIS — E669 Obesity, unspecified: Secondary | ICD-10-CM | POA: Diagnosis not present

## 2016-11-18 DIAGNOSIS — I639 Cerebral infarction, unspecified: Secondary | ICD-10-CM | POA: Diagnosis not present

## 2016-11-18 DIAGNOSIS — E8881 Metabolic syndrome: Secondary | ICD-10-CM | POA: Diagnosis not present

## 2016-11-18 DIAGNOSIS — R252 Cramp and spasm: Secondary | ICD-10-CM | POA: Diagnosis not present

## 2016-11-18 DIAGNOSIS — G4733 Obstructive sleep apnea (adult) (pediatric): Secondary | ICD-10-CM | POA: Diagnosis not present

## 2016-11-18 DIAGNOSIS — H9190 Unspecified hearing loss, unspecified ear: Secondary | ICD-10-CM | POA: Diagnosis not present

## 2016-11-18 DIAGNOSIS — R1083 Colic: Secondary | ICD-10-CM | POA: Diagnosis not present

## 2016-11-18 DIAGNOSIS — Z299 Encounter for prophylactic measures, unspecified: Secondary | ICD-10-CM | POA: Diagnosis not present

## 2016-11-18 DIAGNOSIS — I10 Essential (primary) hypertension: Secondary | ICD-10-CM | POA: Diagnosis not present

## 2016-11-18 DIAGNOSIS — Z6835 Body mass index (BMI) 35.0-35.9, adult: Secondary | ICD-10-CM | POA: Diagnosis not present

## 2017-02-09 DIAGNOSIS — I10 Essential (primary) hypertension: Secondary | ICD-10-CM | POA: Diagnosis not present

## 2017-02-09 DIAGNOSIS — M159 Polyosteoarthritis, unspecified: Secondary | ICD-10-CM | POA: Diagnosis not present

## 2017-02-09 DIAGNOSIS — I639 Cerebral infarction, unspecified: Secondary | ICD-10-CM | POA: Diagnosis not present

## 2017-03-28 DIAGNOSIS — L821 Other seborrheic keratosis: Secondary | ICD-10-CM | POA: Diagnosis not present

## 2017-03-28 DIAGNOSIS — D692 Other nonthrombocytopenic purpura: Secondary | ICD-10-CM | POA: Diagnosis not present

## 2017-03-28 DIAGNOSIS — L570 Actinic keratosis: Secondary | ICD-10-CM | POA: Diagnosis not present

## 2017-03-28 DIAGNOSIS — L720 Epidermal cyst: Secondary | ICD-10-CM | POA: Diagnosis not present

## 2017-03-28 DIAGNOSIS — L905 Scar conditions and fibrosis of skin: Secondary | ICD-10-CM | POA: Diagnosis not present

## 2017-03-28 DIAGNOSIS — Z85828 Personal history of other malignant neoplasm of skin: Secondary | ICD-10-CM | POA: Diagnosis not present

## 2017-05-20 DIAGNOSIS — I10 Essential (primary) hypertension: Secondary | ICD-10-CM | POA: Diagnosis not present

## 2017-05-20 DIAGNOSIS — I639 Cerebral infarction, unspecified: Secondary | ICD-10-CM | POA: Diagnosis not present

## 2017-05-20 DIAGNOSIS — M159 Polyosteoarthritis, unspecified: Secondary | ICD-10-CM | POA: Diagnosis not present

## 2017-05-23 DIAGNOSIS — Z0000 Encounter for general adult medical examination without abnormal findings: Secondary | ICD-10-CM | POA: Diagnosis not present

## 2017-05-23 DIAGNOSIS — M65321 Trigger finger, right index finger: Secondary | ICD-10-CM | POA: Diagnosis not present

## 2017-05-23 DIAGNOSIS — Z79899 Other long term (current) drug therapy: Secondary | ICD-10-CM | POA: Diagnosis not present

## 2017-05-23 DIAGNOSIS — Z299 Encounter for prophylactic measures, unspecified: Secondary | ICD-10-CM | POA: Diagnosis not present

## 2017-05-23 DIAGNOSIS — Z7189 Other specified counseling: Secondary | ICD-10-CM | POA: Diagnosis not present

## 2017-05-23 DIAGNOSIS — Z1211 Encounter for screening for malignant neoplasm of colon: Secondary | ICD-10-CM | POA: Diagnosis not present

## 2017-05-23 DIAGNOSIS — Z6835 Body mass index (BMI) 35.0-35.9, adult: Secondary | ICD-10-CM | POA: Diagnosis not present

## 2017-05-23 DIAGNOSIS — Z1339 Encounter for screening examination for other mental health and behavioral disorders: Secondary | ICD-10-CM | POA: Diagnosis not present

## 2017-05-23 DIAGNOSIS — Z125 Encounter for screening for malignant neoplasm of prostate: Secondary | ICD-10-CM | POA: Diagnosis not present

## 2017-05-23 DIAGNOSIS — R5383 Other fatigue: Secondary | ICD-10-CM | POA: Diagnosis not present

## 2017-05-23 DIAGNOSIS — Z1331 Encounter for screening for depression: Secondary | ICD-10-CM | POA: Diagnosis not present

## 2017-05-23 DIAGNOSIS — Z2821 Immunization not carried out because of patient refusal: Secondary | ICD-10-CM | POA: Diagnosis not present

## 2017-05-23 DIAGNOSIS — I10 Essential (primary) hypertension: Secondary | ICD-10-CM | POA: Diagnosis not present

## 2017-05-23 DIAGNOSIS — I639 Cerebral infarction, unspecified: Secondary | ICD-10-CM | POA: Diagnosis not present

## 2017-05-25 DIAGNOSIS — Z6835 Body mass index (BMI) 35.0-35.9, adult: Secondary | ICD-10-CM | POA: Diagnosis not present

## 2017-05-25 DIAGNOSIS — M65331 Trigger finger, right middle finger: Secondary | ICD-10-CM | POA: Diagnosis not present

## 2017-05-25 DIAGNOSIS — Z299 Encounter for prophylactic measures, unspecified: Secondary | ICD-10-CM | POA: Diagnosis not present

## 2017-05-25 DIAGNOSIS — G4733 Obstructive sleep apnea (adult) (pediatric): Secondary | ICD-10-CM | POA: Diagnosis not present

## 2017-05-25 DIAGNOSIS — E1165 Type 2 diabetes mellitus with hyperglycemia: Secondary | ICD-10-CM | POA: Diagnosis not present

## 2017-05-25 DIAGNOSIS — I639 Cerebral infarction, unspecified: Secondary | ICD-10-CM | POA: Diagnosis not present

## 2017-05-25 DIAGNOSIS — I10 Essential (primary) hypertension: Secondary | ICD-10-CM | POA: Diagnosis not present

## 2017-06-08 DIAGNOSIS — D225 Melanocytic nevi of trunk: Secondary | ICD-10-CM | POA: Diagnosis not present

## 2017-06-08 DIAGNOSIS — L905 Scar conditions and fibrosis of skin: Secondary | ICD-10-CM | POA: Diagnosis not present

## 2017-06-08 DIAGNOSIS — Z85828 Personal history of other malignant neoplasm of skin: Secondary | ICD-10-CM | POA: Diagnosis not present

## 2017-06-08 DIAGNOSIS — L570 Actinic keratosis: Secondary | ICD-10-CM | POA: Diagnosis not present

## 2017-06-08 DIAGNOSIS — L820 Inflamed seborrheic keratosis: Secondary | ICD-10-CM | POA: Diagnosis not present

## 2017-06-08 DIAGNOSIS — L821 Other seborrheic keratosis: Secondary | ICD-10-CM | POA: Diagnosis not present

## 2017-06-08 DIAGNOSIS — L919 Hypertrophic disorder of the skin, unspecified: Secondary | ICD-10-CM | POA: Diagnosis not present

## 2017-06-13 DIAGNOSIS — M159 Polyosteoarthritis, unspecified: Secondary | ICD-10-CM | POA: Diagnosis not present

## 2017-06-13 DIAGNOSIS — I10 Essential (primary) hypertension: Secondary | ICD-10-CM | POA: Diagnosis not present

## 2017-06-13 DIAGNOSIS — I639 Cerebral infarction, unspecified: Secondary | ICD-10-CM | POA: Diagnosis not present

## 2017-07-05 DIAGNOSIS — I639 Cerebral infarction, unspecified: Secondary | ICD-10-CM | POA: Diagnosis not present

## 2017-07-05 DIAGNOSIS — I10 Essential (primary) hypertension: Secondary | ICD-10-CM | POA: Diagnosis not present

## 2017-07-05 DIAGNOSIS — M159 Polyosteoarthritis, unspecified: Secondary | ICD-10-CM | POA: Diagnosis not present

## 2017-07-21 DIAGNOSIS — Z299 Encounter for prophylactic measures, unspecified: Secondary | ICD-10-CM | POA: Diagnosis not present

## 2017-07-21 DIAGNOSIS — E1165 Type 2 diabetes mellitus with hyperglycemia: Secondary | ICD-10-CM | POA: Diagnosis not present

## 2017-07-21 DIAGNOSIS — Z87891 Personal history of nicotine dependence: Secondary | ICD-10-CM | POA: Diagnosis not present

## 2017-07-21 DIAGNOSIS — G4733 Obstructive sleep apnea (adult) (pediatric): Secondary | ICD-10-CM | POA: Diagnosis not present

## 2017-07-21 DIAGNOSIS — I10 Essential (primary) hypertension: Secondary | ICD-10-CM | POA: Diagnosis not present

## 2017-07-21 DIAGNOSIS — Z6834 Body mass index (BMI) 34.0-34.9, adult: Secondary | ICD-10-CM | POA: Diagnosis not present

## 2017-07-27 DIAGNOSIS — M159 Polyosteoarthritis, unspecified: Secondary | ICD-10-CM | POA: Diagnosis not present

## 2017-07-27 DIAGNOSIS — I639 Cerebral infarction, unspecified: Secondary | ICD-10-CM | POA: Diagnosis not present

## 2017-07-27 DIAGNOSIS — I10 Essential (primary) hypertension: Secondary | ICD-10-CM | POA: Diagnosis not present

## 2017-08-09 DIAGNOSIS — H353131 Nonexudative age-related macular degeneration, bilateral, early dry stage: Secondary | ICD-10-CM | POA: Diagnosis not present

## 2017-08-09 DIAGNOSIS — E119 Type 2 diabetes mellitus without complications: Secondary | ICD-10-CM | POA: Diagnosis not present

## 2017-08-09 DIAGNOSIS — H35371 Puckering of macula, right eye: Secondary | ICD-10-CM | POA: Diagnosis not present

## 2017-08-25 DIAGNOSIS — I639 Cerebral infarction, unspecified: Secondary | ICD-10-CM | POA: Diagnosis not present

## 2017-08-25 DIAGNOSIS — M159 Polyosteoarthritis, unspecified: Secondary | ICD-10-CM | POA: Diagnosis not present

## 2017-08-25 DIAGNOSIS — I10 Essential (primary) hypertension: Secondary | ICD-10-CM | POA: Diagnosis not present

## 2017-08-30 DIAGNOSIS — H02413 Mechanical ptosis of bilateral eyelids: Secondary | ICD-10-CM | POA: Diagnosis not present

## 2017-09-01 DIAGNOSIS — E1165 Type 2 diabetes mellitus with hyperglycemia: Secondary | ICD-10-CM | POA: Diagnosis not present

## 2017-09-01 DIAGNOSIS — I10 Essential (primary) hypertension: Secondary | ICD-10-CM | POA: Diagnosis not present

## 2017-09-01 DIAGNOSIS — Z713 Dietary counseling and surveillance: Secondary | ICD-10-CM | POA: Diagnosis not present

## 2017-09-01 DIAGNOSIS — Z299 Encounter for prophylactic measures, unspecified: Secondary | ICD-10-CM | POA: Diagnosis not present

## 2017-09-01 DIAGNOSIS — Z6834 Body mass index (BMI) 34.0-34.9, adult: Secondary | ICD-10-CM | POA: Diagnosis not present

## 2017-09-05 DIAGNOSIS — H02411 Mechanical ptosis of right eyelid: Secondary | ICD-10-CM | POA: Diagnosis not present

## 2017-09-05 DIAGNOSIS — H02412 Mechanical ptosis of left eyelid: Secondary | ICD-10-CM | POA: Diagnosis not present

## 2017-09-05 DIAGNOSIS — H02413 Mechanical ptosis of bilateral eyelids: Secondary | ICD-10-CM | POA: Diagnosis not present

## 2017-09-05 DIAGNOSIS — H5789 Other specified disorders of eye and adnexa: Secondary | ICD-10-CM | POA: Diagnosis not present

## 2017-09-15 DIAGNOSIS — M1990 Unspecified osteoarthritis, unspecified site: Secondary | ICD-10-CM | POA: Diagnosis not present

## 2017-09-15 DIAGNOSIS — M1A09X0 Idiopathic chronic gout, multiple sites, without tophus (tophi): Secondary | ICD-10-CM | POA: Diagnosis not present

## 2017-09-27 DIAGNOSIS — L570 Actinic keratosis: Secondary | ICD-10-CM | POA: Diagnosis not present

## 2017-09-27 DIAGNOSIS — L821 Other seborrheic keratosis: Secondary | ICD-10-CM | POA: Diagnosis not present

## 2017-09-27 DIAGNOSIS — D485 Neoplasm of uncertain behavior of skin: Secondary | ICD-10-CM | POA: Diagnosis not present

## 2017-09-27 DIAGNOSIS — Z85828 Personal history of other malignant neoplasm of skin: Secondary | ICD-10-CM | POA: Diagnosis not present

## 2017-09-27 DIAGNOSIS — L905 Scar conditions and fibrosis of skin: Secondary | ICD-10-CM | POA: Diagnosis not present

## 2017-09-30 DIAGNOSIS — I10 Essential (primary) hypertension: Secondary | ICD-10-CM | POA: Diagnosis not present

## 2017-09-30 DIAGNOSIS — I639 Cerebral infarction, unspecified: Secondary | ICD-10-CM | POA: Diagnosis not present

## 2017-09-30 DIAGNOSIS — M159 Polyosteoarthritis, unspecified: Secondary | ICD-10-CM | POA: Diagnosis not present

## 2017-10-25 DIAGNOSIS — M159 Polyosteoarthritis, unspecified: Secondary | ICD-10-CM | POA: Diagnosis not present

## 2017-10-25 DIAGNOSIS — I639 Cerebral infarction, unspecified: Secondary | ICD-10-CM | POA: Diagnosis not present

## 2017-10-25 DIAGNOSIS — I10 Essential (primary) hypertension: Secondary | ICD-10-CM | POA: Diagnosis not present

## 2017-11-08 DIAGNOSIS — H353131 Nonexudative age-related macular degeneration, bilateral, early dry stage: Secondary | ICD-10-CM | POA: Diagnosis not present

## 2017-11-08 DIAGNOSIS — H43811 Vitreous degeneration, right eye: Secondary | ICD-10-CM | POA: Diagnosis not present

## 2017-11-08 DIAGNOSIS — H35371 Puckering of macula, right eye: Secondary | ICD-10-CM | POA: Diagnosis not present

## 2017-11-29 DIAGNOSIS — H35371 Puckering of macula, right eye: Secondary | ICD-10-CM | POA: Diagnosis not present

## 2017-11-29 DIAGNOSIS — H43811 Vitreous degeneration, right eye: Secondary | ICD-10-CM | POA: Diagnosis not present

## 2017-11-29 DIAGNOSIS — H353131 Nonexudative age-related macular degeneration, bilateral, early dry stage: Secondary | ICD-10-CM | POA: Diagnosis not present

## 2017-12-07 DIAGNOSIS — Z299 Encounter for prophylactic measures, unspecified: Secondary | ICD-10-CM | POA: Diagnosis not present

## 2017-12-07 DIAGNOSIS — Z6833 Body mass index (BMI) 33.0-33.9, adult: Secondary | ICD-10-CM | POA: Diagnosis not present

## 2017-12-07 DIAGNOSIS — E1165 Type 2 diabetes mellitus with hyperglycemia: Secondary | ICD-10-CM | POA: Diagnosis not present

## 2017-12-07 DIAGNOSIS — I10 Essential (primary) hypertension: Secondary | ICD-10-CM | POA: Diagnosis not present

## 2017-12-07 DIAGNOSIS — I639 Cerebral infarction, unspecified: Secondary | ICD-10-CM | POA: Diagnosis not present

## 2017-12-07 DIAGNOSIS — G4733 Obstructive sleep apnea (adult) (pediatric): Secondary | ICD-10-CM | POA: Diagnosis not present

## 2017-12-19 DIAGNOSIS — L821 Other seborrheic keratosis: Secondary | ICD-10-CM | POA: Diagnosis not present

## 2017-12-19 DIAGNOSIS — L570 Actinic keratosis: Secondary | ICD-10-CM | POA: Diagnosis not present

## 2017-12-19 DIAGNOSIS — Z85828 Personal history of other malignant neoplasm of skin: Secondary | ICD-10-CM | POA: Diagnosis not present

## 2017-12-30 DIAGNOSIS — I10 Essential (primary) hypertension: Secondary | ICD-10-CM | POA: Diagnosis not present

## 2017-12-30 DIAGNOSIS — M159 Polyosteoarthritis, unspecified: Secondary | ICD-10-CM | POA: Diagnosis not present

## 2017-12-30 DIAGNOSIS — I639 Cerebral infarction, unspecified: Secondary | ICD-10-CM | POA: Diagnosis not present

## 2018-01-26 DIAGNOSIS — M159 Polyosteoarthritis, unspecified: Secondary | ICD-10-CM | POA: Diagnosis not present

## 2018-01-26 DIAGNOSIS — I639 Cerebral infarction, unspecified: Secondary | ICD-10-CM | POA: Diagnosis not present

## 2018-01-26 DIAGNOSIS — I10 Essential (primary) hypertension: Secondary | ICD-10-CM | POA: Diagnosis not present

## 2018-03-15 DIAGNOSIS — M19042 Primary osteoarthritis, left hand: Secondary | ICD-10-CM | POA: Diagnosis not present

## 2018-03-15 DIAGNOSIS — E8881 Metabolic syndrome: Secondary | ICD-10-CM | POA: Diagnosis not present

## 2018-03-15 DIAGNOSIS — Z299 Encounter for prophylactic measures, unspecified: Secondary | ICD-10-CM | POA: Diagnosis not present

## 2018-03-15 DIAGNOSIS — E1165 Type 2 diabetes mellitus with hyperglycemia: Secondary | ICD-10-CM | POA: Diagnosis not present

## 2018-03-15 DIAGNOSIS — Z6834 Body mass index (BMI) 34.0-34.9, adult: Secondary | ICD-10-CM | POA: Diagnosis not present

## 2018-03-15 DIAGNOSIS — I10 Essential (primary) hypertension: Secondary | ICD-10-CM | POA: Diagnosis not present

## 2018-03-15 DIAGNOSIS — M19041 Primary osteoarthritis, right hand: Secondary | ICD-10-CM | POA: Diagnosis not present

## 2018-03-17 DIAGNOSIS — I639 Cerebral infarction, unspecified: Secondary | ICD-10-CM | POA: Diagnosis not present

## 2018-03-17 DIAGNOSIS — I10 Essential (primary) hypertension: Secondary | ICD-10-CM | POA: Diagnosis not present

## 2018-03-17 DIAGNOSIS — M159 Polyosteoarthritis, unspecified: Secondary | ICD-10-CM | POA: Diagnosis not present

## 2018-03-29 DIAGNOSIS — M159 Polyosteoarthritis, unspecified: Secondary | ICD-10-CM | POA: Diagnosis not present

## 2018-03-29 DIAGNOSIS — I10 Essential (primary) hypertension: Secondary | ICD-10-CM | POA: Diagnosis not present

## 2018-03-29 DIAGNOSIS — I639 Cerebral infarction, unspecified: Secondary | ICD-10-CM | POA: Diagnosis not present

## 2018-03-31 DIAGNOSIS — H35371 Puckering of macula, right eye: Secondary | ICD-10-CM | POA: Diagnosis not present

## 2018-03-31 DIAGNOSIS — H353131 Nonexudative age-related macular degeneration, bilateral, early dry stage: Secondary | ICD-10-CM | POA: Diagnosis not present

## 2018-04-10 DIAGNOSIS — L821 Other seborrheic keratosis: Secondary | ICD-10-CM | POA: Diagnosis not present

## 2018-04-10 DIAGNOSIS — L570 Actinic keratosis: Secondary | ICD-10-CM | POA: Diagnosis not present

## 2018-04-10 DIAGNOSIS — Z85828 Personal history of other malignant neoplasm of skin: Secondary | ICD-10-CM | POA: Diagnosis not present

## 2018-04-10 DIAGNOSIS — L905 Scar conditions and fibrosis of skin: Secondary | ICD-10-CM | POA: Diagnosis not present

## 2018-05-01 DIAGNOSIS — M159 Polyosteoarthritis, unspecified: Secondary | ICD-10-CM | POA: Diagnosis not present

## 2018-05-01 DIAGNOSIS — I639 Cerebral infarction, unspecified: Secondary | ICD-10-CM | POA: Diagnosis not present

## 2018-05-01 DIAGNOSIS — I10 Essential (primary) hypertension: Secondary | ICD-10-CM | POA: Diagnosis not present

## 2018-05-02 IMAGING — CR DG LUMBAR SPINE 1V
1 series · 1 of 1 positions shown · non-contrast
Comparison: Prior lumbar spine MRI 12/02/2015

CLINICAL DATA: 65-year-old male undergoing L4-L5 laminectomy

EXAM:
LUMBAR SPINE - 1 VIEW

[lat]
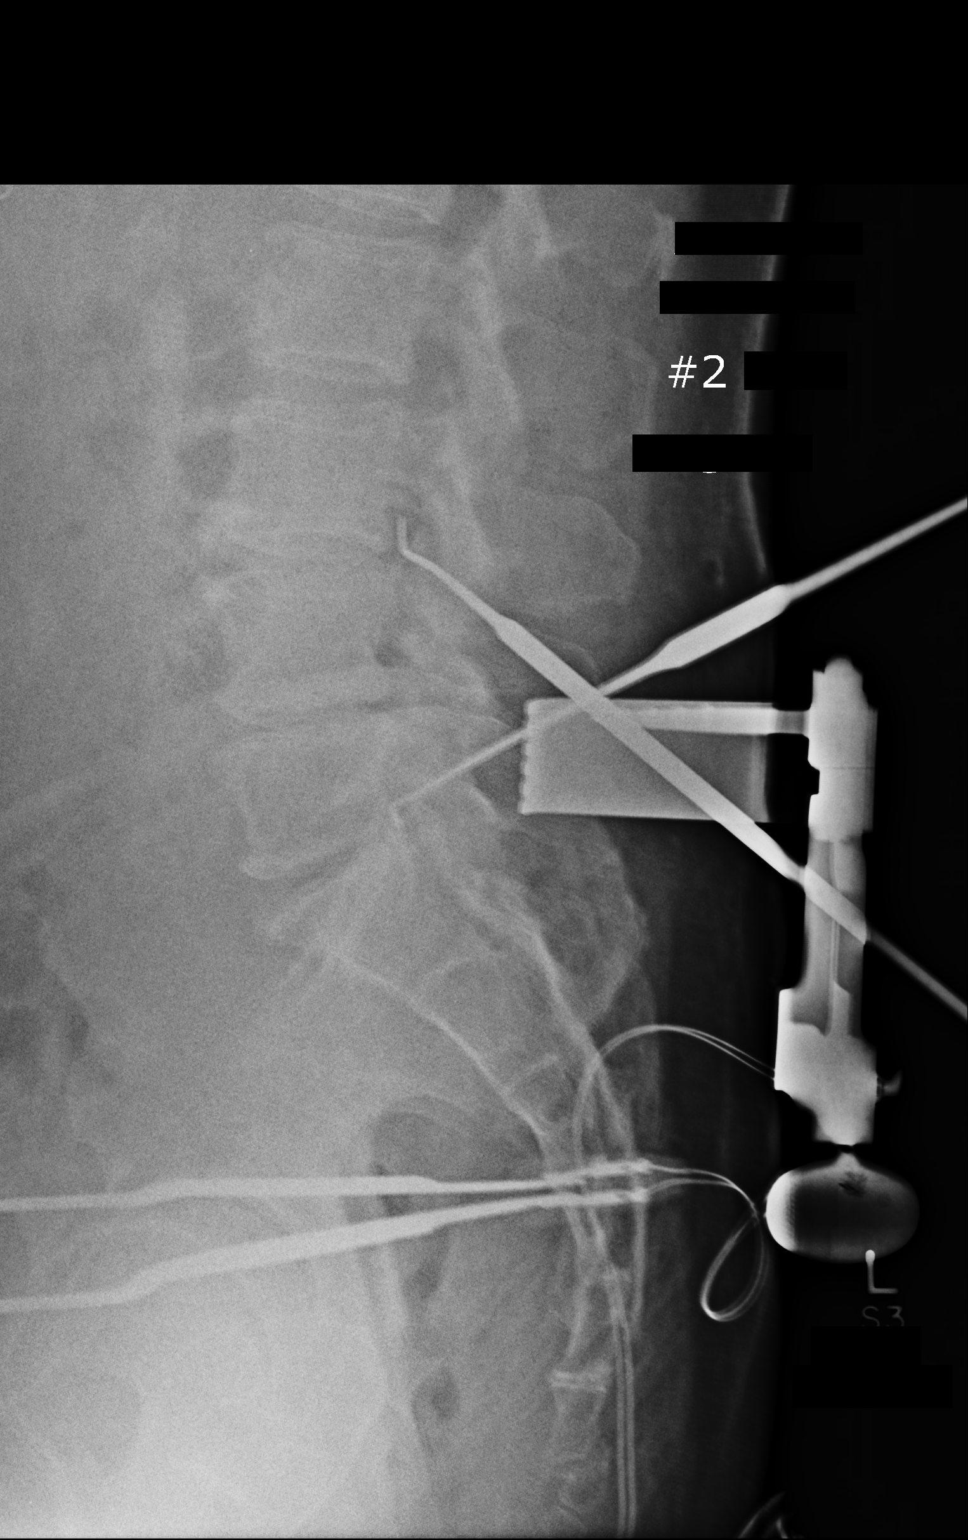

[1 of 1 positions shown; findings below may reference images not displayed]

FINDINGS: Two cross-table intraoperative radiographs were obtained of the
lumbar spine. The first image demonstrates soft tissue retractors
posteriorly in a single metallic marking device at the L4 lamina.
The second image demonstrates 2 radiopaque marking devices, the
first at L3-L4 disc space and the second at the L5-S1 disc space.
IMPRESSION: Intraoperative localization radiographs as above.

## 2018-05-05 DIAGNOSIS — K1120 Sialoadenitis, unspecified: Secondary | ICD-10-CM | POA: Diagnosis not present

## 2018-05-05 DIAGNOSIS — H43813 Vitreous degeneration, bilateral: Secondary | ICD-10-CM | POA: Diagnosis not present

## 2018-05-05 DIAGNOSIS — H353131 Nonexudative age-related macular degeneration, bilateral, early dry stage: Secondary | ICD-10-CM | POA: Diagnosis not present

## 2018-05-05 DIAGNOSIS — H35371 Puckering of macula, right eye: Secondary | ICD-10-CM | POA: Diagnosis not present

## 2018-05-06 DIAGNOSIS — I10 Essential (primary) hypertension: Secondary | ICD-10-CM | POA: Diagnosis not present

## 2018-05-06 DIAGNOSIS — Z87891 Personal history of nicotine dependence: Secondary | ICD-10-CM | POA: Diagnosis not present

## 2018-05-06 DIAGNOSIS — Z7982 Long term (current) use of aspirin: Secondary | ICD-10-CM | POA: Diagnosis not present

## 2018-05-06 DIAGNOSIS — Z79899 Other long term (current) drug therapy: Secondary | ICD-10-CM | POA: Diagnosis not present

## 2018-05-06 DIAGNOSIS — M109 Gout, unspecified: Secondary | ICD-10-CM | POA: Diagnosis not present

## 2018-05-06 DIAGNOSIS — K1120 Sialoadenitis, unspecified: Secondary | ICD-10-CM | POA: Diagnosis not present

## 2018-05-08 DIAGNOSIS — Z299 Encounter for prophylactic measures, unspecified: Secondary | ICD-10-CM | POA: Diagnosis not present

## 2018-05-08 DIAGNOSIS — I10 Essential (primary) hypertension: Secondary | ICD-10-CM | POA: Diagnosis not present

## 2018-05-08 DIAGNOSIS — K1121 Acute sialoadenitis: Secondary | ICD-10-CM | POA: Diagnosis not present

## 2018-05-08 DIAGNOSIS — Z2821 Immunization not carried out because of patient refusal: Secondary | ICD-10-CM | POA: Diagnosis not present

## 2018-05-08 DIAGNOSIS — Z6835 Body mass index (BMI) 35.0-35.9, adult: Secondary | ICD-10-CM | POA: Diagnosis not present

## 2018-05-09 DIAGNOSIS — L570 Actinic keratosis: Secondary | ICD-10-CM | POA: Diagnosis not present

## 2018-05-09 DIAGNOSIS — Z85828 Personal history of other malignant neoplasm of skin: Secondary | ICD-10-CM | POA: Diagnosis not present

## 2018-05-09 DIAGNOSIS — L720 Epidermal cyst: Secondary | ICD-10-CM | POA: Diagnosis not present

## 2018-05-09 DIAGNOSIS — L821 Other seborrheic keratosis: Secondary | ICD-10-CM | POA: Diagnosis not present

## 2018-05-31 DIAGNOSIS — I10 Essential (primary) hypertension: Secondary | ICD-10-CM | POA: Diagnosis not present

## 2018-05-31 DIAGNOSIS — M159 Polyosteoarthritis, unspecified: Secondary | ICD-10-CM | POA: Diagnosis not present

## 2018-05-31 DIAGNOSIS — I639 Cerebral infarction, unspecified: Secondary | ICD-10-CM | POA: Diagnosis not present

## 2018-06-01 DIAGNOSIS — Z299 Encounter for prophylactic measures, unspecified: Secondary | ICD-10-CM | POA: Diagnosis not present

## 2018-06-01 DIAGNOSIS — E7800 Pure hypercholesterolemia, unspecified: Secondary | ICD-10-CM | POA: Diagnosis not present

## 2018-06-01 DIAGNOSIS — I10 Essential (primary) hypertension: Secondary | ICD-10-CM | POA: Diagnosis not present

## 2018-06-01 DIAGNOSIS — Z7189 Other specified counseling: Secondary | ICD-10-CM | POA: Diagnosis not present

## 2018-06-01 DIAGNOSIS — Z6836 Body mass index (BMI) 36.0-36.9, adult: Secondary | ICD-10-CM | POA: Diagnosis not present

## 2018-06-01 DIAGNOSIS — Z1211 Encounter for screening for malignant neoplasm of colon: Secondary | ICD-10-CM | POA: Diagnosis not present

## 2018-06-01 DIAGNOSIS — Z1339 Encounter for screening examination for other mental health and behavioral disorders: Secondary | ICD-10-CM | POA: Diagnosis not present

## 2018-06-01 DIAGNOSIS — Z0000 Encounter for general adult medical examination without abnormal findings: Secondary | ICD-10-CM | POA: Diagnosis not present

## 2018-06-01 DIAGNOSIS — R5383 Other fatigue: Secondary | ICD-10-CM | POA: Diagnosis not present

## 2018-06-01 DIAGNOSIS — Z1331 Encounter for screening for depression: Secondary | ICD-10-CM | POA: Diagnosis not present

## 2018-06-01 DIAGNOSIS — E1165 Type 2 diabetes mellitus with hyperglycemia: Secondary | ICD-10-CM | POA: Diagnosis not present

## 2018-06-28 DIAGNOSIS — M159 Polyosteoarthritis, unspecified: Secondary | ICD-10-CM | POA: Diagnosis not present

## 2018-06-28 DIAGNOSIS — I10 Essential (primary) hypertension: Secondary | ICD-10-CM | POA: Diagnosis not present

## 2018-06-28 DIAGNOSIS — I639 Cerebral infarction, unspecified: Secondary | ICD-10-CM | POA: Diagnosis not present

## 2018-07-05 DIAGNOSIS — E8881 Metabolic syndrome: Secondary | ICD-10-CM | POA: Diagnosis not present

## 2018-07-05 DIAGNOSIS — Z6836 Body mass index (BMI) 36.0-36.9, adult: Secondary | ICD-10-CM | POA: Diagnosis not present

## 2018-07-05 DIAGNOSIS — G4733 Obstructive sleep apnea (adult) (pediatric): Secondary | ICD-10-CM | POA: Diagnosis not present

## 2018-07-05 DIAGNOSIS — Z87891 Personal history of nicotine dependence: Secondary | ICD-10-CM | POA: Diagnosis not present

## 2018-07-05 DIAGNOSIS — Z299 Encounter for prophylactic measures, unspecified: Secondary | ICD-10-CM | POA: Diagnosis not present

## 2018-07-05 DIAGNOSIS — I10 Essential (primary) hypertension: Secondary | ICD-10-CM | POA: Diagnosis not present

## 2018-07-05 DIAGNOSIS — E1165 Type 2 diabetes mellitus with hyperglycemia: Secondary | ICD-10-CM | POA: Diagnosis not present

## 2018-07-10 DIAGNOSIS — L905 Scar conditions and fibrosis of skin: Secondary | ICD-10-CM | POA: Diagnosis not present

## 2018-07-10 DIAGNOSIS — L820 Inflamed seborrheic keratosis: Secondary | ICD-10-CM | POA: Diagnosis not present

## 2018-07-10 DIAGNOSIS — D485 Neoplasm of uncertain behavior of skin: Secondary | ICD-10-CM | POA: Diagnosis not present

## 2018-07-10 DIAGNOSIS — C44329 Squamous cell carcinoma of skin of other parts of face: Secondary | ICD-10-CM | POA: Diagnosis not present

## 2018-07-10 DIAGNOSIS — L919 Hypertrophic disorder of the skin, unspecified: Secondary | ICD-10-CM | POA: Diagnosis not present

## 2018-07-10 DIAGNOSIS — L570 Actinic keratosis: Secondary | ICD-10-CM | POA: Diagnosis not present

## 2018-07-10 DIAGNOSIS — L821 Other seborrheic keratosis: Secondary | ICD-10-CM | POA: Diagnosis not present

## 2018-07-10 DIAGNOSIS — Z85828 Personal history of other malignant neoplasm of skin: Secondary | ICD-10-CM | POA: Diagnosis not present

## 2018-07-13 DIAGNOSIS — C44329 Squamous cell carcinoma of skin of other parts of face: Secondary | ICD-10-CM | POA: Diagnosis not present

## 2018-07-13 DIAGNOSIS — L905 Scar conditions and fibrosis of skin: Secondary | ICD-10-CM | POA: Diagnosis not present

## 2018-07-14 DIAGNOSIS — Z6835 Body mass index (BMI) 35.0-35.9, adult: Secondary | ICD-10-CM | POA: Diagnosis not present

## 2018-07-14 DIAGNOSIS — I639 Cerebral infarction, unspecified: Secondary | ICD-10-CM | POA: Diagnosis not present

## 2018-07-14 DIAGNOSIS — Z299 Encounter for prophylactic measures, unspecified: Secondary | ICD-10-CM | POA: Diagnosis not present

## 2018-07-14 DIAGNOSIS — E1165 Type 2 diabetes mellitus with hyperglycemia: Secondary | ICD-10-CM | POA: Diagnosis not present

## 2018-07-14 DIAGNOSIS — I10 Essential (primary) hypertension: Secondary | ICD-10-CM | POA: Diagnosis not present

## 2018-07-31 DIAGNOSIS — I10 Essential (primary) hypertension: Secondary | ICD-10-CM | POA: Diagnosis not present

## 2018-07-31 DIAGNOSIS — M159 Polyosteoarthritis, unspecified: Secondary | ICD-10-CM | POA: Diagnosis not present

## 2018-07-31 DIAGNOSIS — I639 Cerebral infarction, unspecified: Secondary | ICD-10-CM | POA: Diagnosis not present

## 2018-08-30 DIAGNOSIS — I639 Cerebral infarction, unspecified: Secondary | ICD-10-CM | POA: Diagnosis not present

## 2018-08-30 DIAGNOSIS — M159 Polyosteoarthritis, unspecified: Secondary | ICD-10-CM | POA: Diagnosis not present

## 2018-08-30 DIAGNOSIS — I10 Essential (primary) hypertension: Secondary | ICD-10-CM | POA: Diagnosis not present

## 2018-09-18 DIAGNOSIS — M1A09X0 Idiopathic chronic gout, multiple sites, without tophus (tophi): Secondary | ICD-10-CM | POA: Diagnosis not present

## 2018-09-18 DIAGNOSIS — M1990 Unspecified osteoarthritis, unspecified site: Secondary | ICD-10-CM | POA: Diagnosis not present

## 2018-09-18 DIAGNOSIS — I10 Essential (primary) hypertension: Secondary | ICD-10-CM | POA: Diagnosis not present

## 2018-10-09 DIAGNOSIS — Z79899 Other long term (current) drug therapy: Secondary | ICD-10-CM | POA: Diagnosis not present

## 2018-10-09 DIAGNOSIS — R5383 Other fatigue: Secondary | ICD-10-CM | POA: Diagnosis not present

## 2018-10-09 DIAGNOSIS — I10 Essential (primary) hypertension: Secondary | ICD-10-CM | POA: Diagnosis not present

## 2018-10-09 DIAGNOSIS — Z6835 Body mass index (BMI) 35.0-35.9, adult: Secondary | ICD-10-CM | POA: Diagnosis not present

## 2018-10-09 DIAGNOSIS — E7800 Pure hypercholesterolemia, unspecified: Secondary | ICD-10-CM | POA: Diagnosis not present

## 2018-10-09 DIAGNOSIS — E1165 Type 2 diabetes mellitus with hyperglycemia: Secondary | ICD-10-CM | POA: Diagnosis not present

## 2018-10-09 DIAGNOSIS — Z125 Encounter for screening for malignant neoplasm of prostate: Secondary | ICD-10-CM | POA: Diagnosis not present

## 2018-10-09 DIAGNOSIS — I639 Cerebral infarction, unspecified: Secondary | ICD-10-CM | POA: Diagnosis not present

## 2018-10-09 DIAGNOSIS — Z299 Encounter for prophylactic measures, unspecified: Secondary | ICD-10-CM | POA: Diagnosis not present

## 2018-10-09 DIAGNOSIS — G4733 Obstructive sleep apnea (adult) (pediatric): Secondary | ICD-10-CM | POA: Diagnosis not present

## 2018-10-10 DIAGNOSIS — I639 Cerebral infarction, unspecified: Secondary | ICD-10-CM | POA: Diagnosis not present

## 2018-10-10 DIAGNOSIS — I10 Essential (primary) hypertension: Secondary | ICD-10-CM | POA: Diagnosis not present

## 2018-10-10 DIAGNOSIS — M159 Polyosteoarthritis, unspecified: Secondary | ICD-10-CM | POA: Diagnosis not present

## 2018-11-02 DIAGNOSIS — I639 Cerebral infarction, unspecified: Secondary | ICD-10-CM | POA: Diagnosis not present

## 2018-11-02 DIAGNOSIS — I10 Essential (primary) hypertension: Secondary | ICD-10-CM | POA: Diagnosis not present

## 2018-11-02 DIAGNOSIS — M159 Polyosteoarthritis, unspecified: Secondary | ICD-10-CM | POA: Diagnosis not present

## 2018-11-06 DIAGNOSIS — L919 Hypertrophic disorder of the skin, unspecified: Secondary | ICD-10-CM | POA: Diagnosis not present

## 2018-11-06 DIAGNOSIS — L570 Actinic keratosis: Secondary | ICD-10-CM | POA: Diagnosis not present

## 2018-11-06 DIAGNOSIS — Z85828 Personal history of other malignant neoplasm of skin: Secondary | ICD-10-CM | POA: Diagnosis not present

## 2018-11-06 DIAGNOSIS — L905 Scar conditions and fibrosis of skin: Secondary | ICD-10-CM | POA: Diagnosis not present

## 2018-11-06 DIAGNOSIS — C44619 Basal cell carcinoma of skin of left upper limb, including shoulder: Secondary | ICD-10-CM | POA: Diagnosis not present

## 2018-11-06 DIAGNOSIS — L821 Other seborrheic keratosis: Secondary | ICD-10-CM | POA: Diagnosis not present

## 2018-11-06 DIAGNOSIS — D485 Neoplasm of uncertain behavior of skin: Secondary | ICD-10-CM | POA: Diagnosis not present

## 2018-11-10 DIAGNOSIS — Z6834 Body mass index (BMI) 34.0-34.9, adult: Secondary | ICD-10-CM | POA: Diagnosis not present

## 2018-11-10 DIAGNOSIS — E1165 Type 2 diabetes mellitus with hyperglycemia: Secondary | ICD-10-CM | POA: Diagnosis not present

## 2018-11-10 DIAGNOSIS — I10 Essential (primary) hypertension: Secondary | ICD-10-CM | POA: Diagnosis not present

## 2018-11-10 DIAGNOSIS — G4733 Obstructive sleep apnea (adult) (pediatric): Secondary | ICD-10-CM | POA: Diagnosis not present

## 2018-11-10 DIAGNOSIS — I639 Cerebral infarction, unspecified: Secondary | ICD-10-CM | POA: Diagnosis not present

## 2018-11-10 DIAGNOSIS — Z299 Encounter for prophylactic measures, unspecified: Secondary | ICD-10-CM | POA: Diagnosis not present

## 2018-11-10 DIAGNOSIS — Z87891 Personal history of nicotine dependence: Secondary | ICD-10-CM | POA: Diagnosis not present

## 2018-11-22 DIAGNOSIS — C44619 Basal cell carcinoma of skin of left upper limb, including shoulder: Secondary | ICD-10-CM | POA: Diagnosis not present

## 2018-11-22 DIAGNOSIS — C44519 Basal cell carcinoma of skin of other part of trunk: Secondary | ICD-10-CM | POA: Diagnosis not present

## 2018-12-01 DIAGNOSIS — I10 Essential (primary) hypertension: Secondary | ICD-10-CM | POA: Diagnosis not present

## 2018-12-01 DIAGNOSIS — M159 Polyosteoarthritis, unspecified: Secondary | ICD-10-CM | POA: Diagnosis not present

## 2018-12-01 DIAGNOSIS — I639 Cerebral infarction, unspecified: Secondary | ICD-10-CM | POA: Diagnosis not present

## 2018-12-06 DIAGNOSIS — L728 Other follicular cysts of the skin and subcutaneous tissue: Secondary | ICD-10-CM | POA: Diagnosis not present

## 2018-12-06 DIAGNOSIS — Z85828 Personal history of other malignant neoplasm of skin: Secondary | ICD-10-CM | POA: Diagnosis not present

## 2018-12-06 DIAGNOSIS — L905 Scar conditions and fibrosis of skin: Secondary | ICD-10-CM | POA: Diagnosis not present

## 2018-12-06 DIAGNOSIS — L570 Actinic keratosis: Secondary | ICD-10-CM | POA: Diagnosis not present

## 2018-12-06 DIAGNOSIS — D485 Neoplasm of uncertain behavior of skin: Secondary | ICD-10-CM | POA: Diagnosis not present

## 2018-12-21 ENCOUNTER — Other Ambulatory Visit: Payer: Self-pay

## 2018-12-29 DIAGNOSIS — I639 Cerebral infarction, unspecified: Secondary | ICD-10-CM | POA: Diagnosis not present

## 2018-12-29 DIAGNOSIS — I10 Essential (primary) hypertension: Secondary | ICD-10-CM | POA: Diagnosis not present

## 2018-12-29 DIAGNOSIS — M159 Polyosteoarthritis, unspecified: Secondary | ICD-10-CM | POA: Diagnosis not present

## 2019-01-15 DIAGNOSIS — E1165 Type 2 diabetes mellitus with hyperglycemia: Secondary | ICD-10-CM | POA: Diagnosis not present

## 2019-01-15 DIAGNOSIS — Z6835 Body mass index (BMI) 35.0-35.9, adult: Secondary | ICD-10-CM | POA: Diagnosis not present

## 2019-01-15 DIAGNOSIS — I10 Essential (primary) hypertension: Secondary | ICD-10-CM | POA: Diagnosis not present

## 2019-01-15 DIAGNOSIS — Z713 Dietary counseling and surveillance: Secondary | ICD-10-CM | POA: Diagnosis not present

## 2019-01-15 DIAGNOSIS — Z299 Encounter for prophylactic measures, unspecified: Secondary | ICD-10-CM | POA: Diagnosis not present

## 2019-02-16 DIAGNOSIS — M159 Polyosteoarthritis, unspecified: Secondary | ICD-10-CM | POA: Diagnosis not present

## 2019-02-16 DIAGNOSIS — I639 Cerebral infarction, unspecified: Secondary | ICD-10-CM | POA: Diagnosis not present

## 2019-02-16 DIAGNOSIS — I10 Essential (primary) hypertension: Secondary | ICD-10-CM | POA: Diagnosis not present

## 2019-03-07 DIAGNOSIS — L820 Inflamed seborrheic keratosis: Secondary | ICD-10-CM | POA: Diagnosis not present

## 2019-03-07 DIAGNOSIS — Z85828 Personal history of other malignant neoplasm of skin: Secondary | ICD-10-CM | POA: Diagnosis not present

## 2019-03-07 DIAGNOSIS — L905 Scar conditions and fibrosis of skin: Secondary | ICD-10-CM | POA: Diagnosis not present

## 2019-03-07 DIAGNOSIS — L821 Other seborrheic keratosis: Secondary | ICD-10-CM | POA: Diagnosis not present

## 2019-03-07 DIAGNOSIS — L570 Actinic keratosis: Secondary | ICD-10-CM | POA: Diagnosis not present

## 2019-04-04 DIAGNOSIS — D485 Neoplasm of uncertain behavior of skin: Secondary | ICD-10-CM | POA: Diagnosis not present

## 2019-04-04 DIAGNOSIS — L821 Other seborrheic keratosis: Secondary | ICD-10-CM | POA: Diagnosis not present

## 2019-04-04 DIAGNOSIS — L919 Hypertrophic disorder of the skin, unspecified: Secondary | ICD-10-CM | POA: Diagnosis not present

## 2019-04-04 DIAGNOSIS — Z85828 Personal history of other malignant neoplasm of skin: Secondary | ICD-10-CM | POA: Diagnosis not present

## 2019-04-06 DIAGNOSIS — K118 Other diseases of salivary glands: Secondary | ICD-10-CM | POA: Insufficient documentation

## 2019-04-06 DIAGNOSIS — K1120 Sialoadenitis, unspecified: Secondary | ICD-10-CM | POA: Diagnosis not present

## 2019-04-06 HISTORY — DX: Other diseases of salivary glands: K11.8

## 2019-04-24 ENCOUNTER — Other Ambulatory Visit: Payer: Self-pay | Admitting: Otolaryngology

## 2019-04-24 ENCOUNTER — Other Ambulatory Visit (HOSPITAL_COMMUNITY): Payer: Self-pay | Admitting: Otolaryngology

## 2019-05-03 DIAGNOSIS — I639 Cerebral infarction, unspecified: Secondary | ICD-10-CM | POA: Diagnosis not present

## 2019-05-03 DIAGNOSIS — M159 Polyosteoarthritis, unspecified: Secondary | ICD-10-CM | POA: Diagnosis not present

## 2019-05-03 DIAGNOSIS — I10 Essential (primary) hypertension: Secondary | ICD-10-CM | POA: Diagnosis not present

## 2019-06-07 DIAGNOSIS — M159 Polyosteoarthritis, unspecified: Secondary | ICD-10-CM | POA: Diagnosis not present

## 2019-06-07 DIAGNOSIS — I639 Cerebral infarction, unspecified: Secondary | ICD-10-CM | POA: Diagnosis not present

## 2019-06-07 DIAGNOSIS — I10 Essential (primary) hypertension: Secondary | ICD-10-CM | POA: Diagnosis not present

## 2019-06-18 DIAGNOSIS — K1120 Sialoadenitis, unspecified: Secondary | ICD-10-CM | POA: Diagnosis not present

## 2019-06-18 DIAGNOSIS — Z87891 Personal history of nicotine dependence: Secondary | ICD-10-CM | POA: Diagnosis not present

## 2019-06-18 DIAGNOSIS — K1123 Chronic sialoadenitis: Secondary | ICD-10-CM | POA: Diagnosis not present

## 2019-06-20 DIAGNOSIS — K115 Sialolithiasis: Secondary | ICD-10-CM | POA: Diagnosis not present

## 2019-07-17 DIAGNOSIS — K1120 Sialoadenitis, unspecified: Secondary | ICD-10-CM

## 2019-07-17 DIAGNOSIS — L905 Scar conditions and fibrosis of skin: Secondary | ICD-10-CM | POA: Diagnosis not present

## 2019-07-17 DIAGNOSIS — L853 Xerosis cutis: Secondary | ICD-10-CM | POA: Diagnosis not present

## 2019-07-17 DIAGNOSIS — Z85828 Personal history of other malignant neoplasm of skin: Secondary | ICD-10-CM | POA: Diagnosis not present

## 2019-07-17 DIAGNOSIS — L578 Other skin changes due to chronic exposure to nonionizing radiation: Secondary | ICD-10-CM | POA: Diagnosis not present

## 2019-07-17 DIAGNOSIS — L821 Other seborrheic keratosis: Secondary | ICD-10-CM | POA: Diagnosis not present

## 2019-07-17 HISTORY — DX: Sialoadenitis, unspecified: K11.20

## 2019-07-20 DIAGNOSIS — I639 Cerebral infarction, unspecified: Secondary | ICD-10-CM | POA: Diagnosis not present

## 2019-07-20 DIAGNOSIS — I10 Essential (primary) hypertension: Secondary | ICD-10-CM | POA: Diagnosis not present

## 2019-07-20 DIAGNOSIS — M159 Polyosteoarthritis, unspecified: Secondary | ICD-10-CM | POA: Diagnosis not present

## 2019-08-09 DIAGNOSIS — E1165 Type 2 diabetes mellitus with hyperglycemia: Secondary | ICD-10-CM | POA: Diagnosis not present

## 2019-08-09 DIAGNOSIS — Z6834 Body mass index (BMI) 34.0-34.9, adult: Secondary | ICD-10-CM | POA: Diagnosis not present

## 2019-08-09 DIAGNOSIS — I10 Essential (primary) hypertension: Secondary | ICD-10-CM | POA: Diagnosis not present

## 2019-08-14 DIAGNOSIS — M159 Polyosteoarthritis, unspecified: Secondary | ICD-10-CM | POA: Diagnosis not present

## 2019-08-14 DIAGNOSIS — I639 Cerebral infarction, unspecified: Secondary | ICD-10-CM | POA: Diagnosis not present

## 2019-08-14 DIAGNOSIS — I10 Essential (primary) hypertension: Secondary | ICD-10-CM | POA: Diagnosis not present

## 2019-08-29 DIAGNOSIS — K118 Other diseases of salivary glands: Secondary | ICD-10-CM | POA: Diagnosis not present

## 2019-08-29 DIAGNOSIS — Z20822 Contact with and (suspected) exposure to covid-19: Secondary | ICD-10-CM | POA: Diagnosis not present

## 2019-08-29 DIAGNOSIS — K1120 Sialoadenitis, unspecified: Secondary | ICD-10-CM | POA: Diagnosis not present

## 2019-08-29 DIAGNOSIS — Z01812 Encounter for preprocedural laboratory examination: Secondary | ICD-10-CM | POA: Diagnosis not present

## 2019-08-29 DIAGNOSIS — E119 Type 2 diabetes mellitus without complications: Secondary | ICD-10-CM | POA: Insufficient documentation

## 2019-08-29 DIAGNOSIS — G4733 Obstructive sleep apnea (adult) (pediatric): Secondary | ICD-10-CM | POA: Insufficient documentation

## 2019-08-29 DIAGNOSIS — K219 Gastro-esophageal reflux disease without esophagitis: Secondary | ICD-10-CM | POA: Insufficient documentation

## 2019-09-04 DIAGNOSIS — K115 Sialolithiasis: Secondary | ICD-10-CM | POA: Diagnosis not present

## 2019-09-04 DIAGNOSIS — E119 Type 2 diabetes mellitus without complications: Secondary | ICD-10-CM | POA: Diagnosis not present

## 2019-09-11 DIAGNOSIS — M159 Polyosteoarthritis, unspecified: Secondary | ICD-10-CM | POA: Diagnosis not present

## 2019-09-11 DIAGNOSIS — I639 Cerebral infarction, unspecified: Secondary | ICD-10-CM | POA: Diagnosis not present

## 2019-09-11 DIAGNOSIS — I10 Essential (primary) hypertension: Secondary | ICD-10-CM | POA: Diagnosis not present

## 2019-09-17 DIAGNOSIS — K115 Sialolithiasis: Secondary | ICD-10-CM | POA: Diagnosis not present

## 2019-09-17 DIAGNOSIS — Z87891 Personal history of nicotine dependence: Secondary | ICD-10-CM | POA: Diagnosis not present

## 2019-09-17 DIAGNOSIS — Z48815 Encounter for surgical aftercare following surgery on the digestive system: Secondary | ICD-10-CM | POA: Diagnosis not present

## 2019-09-18 DIAGNOSIS — M7551 Bursitis of right shoulder: Secondary | ICD-10-CM | POA: Diagnosis not present

## 2019-09-18 DIAGNOSIS — I10 Essential (primary) hypertension: Secondary | ICD-10-CM | POA: Diagnosis not present

## 2019-09-18 DIAGNOSIS — M1A09X0 Idiopathic chronic gout, multiple sites, without tophus (tophi): Secondary | ICD-10-CM | POA: Diagnosis not present

## 2019-09-18 DIAGNOSIS — M1990 Unspecified osteoarthritis, unspecified site: Secondary | ICD-10-CM | POA: Diagnosis not present

## 2019-10-11 DIAGNOSIS — Z125 Encounter for screening for malignant neoplasm of prostate: Secondary | ICD-10-CM | POA: Diagnosis not present

## 2019-10-11 DIAGNOSIS — Z1211 Encounter for screening for malignant neoplasm of colon: Secondary | ICD-10-CM | POA: Diagnosis not present

## 2019-10-11 DIAGNOSIS — Z0000 Encounter for general adult medical examination without abnormal findings: Secondary | ICD-10-CM | POA: Diagnosis not present

## 2019-10-11 DIAGNOSIS — R5383 Other fatigue: Secondary | ICD-10-CM | POA: Diagnosis not present

## 2019-10-11 DIAGNOSIS — I10 Essential (primary) hypertension: Secondary | ICD-10-CM | POA: Diagnosis not present

## 2019-10-11 DIAGNOSIS — E7800 Pure hypercholesterolemia, unspecified: Secondary | ICD-10-CM | POA: Diagnosis not present

## 2019-10-11 DIAGNOSIS — Z7189 Other specified counseling: Secondary | ICD-10-CM | POA: Diagnosis not present

## 2019-10-11 DIAGNOSIS — Z6834 Body mass index (BMI) 34.0-34.9, adult: Secondary | ICD-10-CM | POA: Diagnosis not present

## 2019-10-11 DIAGNOSIS — Z79899 Other long term (current) drug therapy: Secondary | ICD-10-CM | POA: Diagnosis not present

## 2019-10-11 DIAGNOSIS — Z299 Encounter for prophylactic measures, unspecified: Secondary | ICD-10-CM | POA: Diagnosis not present

## 2019-10-11 DIAGNOSIS — Z1339 Encounter for screening examination for other mental health and behavioral disorders: Secondary | ICD-10-CM | POA: Diagnosis not present

## 2019-10-11 DIAGNOSIS — Z1331 Encounter for screening for depression: Secondary | ICD-10-CM | POA: Diagnosis not present

## 2019-10-11 DIAGNOSIS — E1165 Type 2 diabetes mellitus with hyperglycemia: Secondary | ICD-10-CM | POA: Diagnosis not present

## 2019-10-21 DIAGNOSIS — I10 Essential (primary) hypertension: Secondary | ICD-10-CM | POA: Diagnosis not present

## 2019-10-21 DIAGNOSIS — I639 Cerebral infarction, unspecified: Secondary | ICD-10-CM | POA: Diagnosis not present

## 2019-10-21 DIAGNOSIS — M159 Polyosteoarthritis, unspecified: Secondary | ICD-10-CM | POA: Diagnosis not present

## 2019-11-21 DIAGNOSIS — I639 Cerebral infarction, unspecified: Secondary | ICD-10-CM | POA: Diagnosis not present

## 2019-11-21 DIAGNOSIS — I10 Essential (primary) hypertension: Secondary | ICD-10-CM | POA: Diagnosis not present

## 2019-11-21 DIAGNOSIS — M159 Polyosteoarthritis, unspecified: Secondary | ICD-10-CM | POA: Diagnosis not present

## 2019-12-21 DIAGNOSIS — I10 Essential (primary) hypertension: Secondary | ICD-10-CM | POA: Diagnosis not present

## 2019-12-21 DIAGNOSIS — M159 Polyosteoarthritis, unspecified: Secondary | ICD-10-CM | POA: Diagnosis not present

## 2019-12-21 DIAGNOSIS — I639 Cerebral infarction, unspecified: Secondary | ICD-10-CM | POA: Diagnosis not present

## 2020-02-21 DIAGNOSIS — I10 Essential (primary) hypertension: Secondary | ICD-10-CM | POA: Diagnosis not present

## 2020-02-21 DIAGNOSIS — M159 Polyosteoarthritis, unspecified: Secondary | ICD-10-CM | POA: Diagnosis not present

## 2020-02-21 DIAGNOSIS — I639 Cerebral infarction, unspecified: Secondary | ICD-10-CM | POA: Diagnosis not present

## 2020-03-10 DIAGNOSIS — L821 Other seborrheic keratosis: Secondary | ICD-10-CM | POA: Diagnosis not present

## 2020-03-10 DIAGNOSIS — Z85828 Personal history of other malignant neoplasm of skin: Secondary | ICD-10-CM | POA: Diagnosis not present

## 2020-03-10 DIAGNOSIS — L905 Scar conditions and fibrosis of skin: Secondary | ICD-10-CM | POA: Diagnosis not present

## 2020-03-10 DIAGNOSIS — L820 Inflamed seborrheic keratosis: Secondary | ICD-10-CM | POA: Diagnosis not present

## 2020-03-10 DIAGNOSIS — L570 Actinic keratosis: Secondary | ICD-10-CM | POA: Diagnosis not present

## 2020-03-10 DIAGNOSIS — D692 Other nonthrombocytopenic purpura: Secondary | ICD-10-CM | POA: Diagnosis not present

## 2020-03-10 DIAGNOSIS — L853 Xerosis cutis: Secondary | ICD-10-CM | POA: Diagnosis not present

## 2020-03-18 DIAGNOSIS — K029 Dental caries, unspecified: Secondary | ICD-10-CM | POA: Diagnosis not present

## 2020-03-18 DIAGNOSIS — R509 Fever, unspecified: Secondary | ICD-10-CM | POA: Diagnosis not present

## 2020-03-18 DIAGNOSIS — Z91041 Radiographic dye allergy status: Secondary | ICD-10-CM | POA: Diagnosis not present

## 2020-03-18 DIAGNOSIS — E119 Type 2 diabetes mellitus without complications: Secondary | ICD-10-CM | POA: Diagnosis not present

## 2020-03-18 DIAGNOSIS — L03211 Cellulitis of face: Secondary | ICD-10-CM | POA: Diagnosis not present

## 2020-03-18 DIAGNOSIS — Z79899 Other long term (current) drug therapy: Secondary | ICD-10-CM | POA: Diagnosis not present

## 2020-03-18 DIAGNOSIS — Z7982 Long term (current) use of aspirin: Secondary | ICD-10-CM | POA: Diagnosis not present

## 2020-03-18 DIAGNOSIS — Z87828 Personal history of other (healed) physical injury and trauma: Secondary | ICD-10-CM | POA: Diagnosis not present

## 2020-03-18 DIAGNOSIS — Z20822 Contact with and (suspected) exposure to covid-19: Secondary | ICD-10-CM | POA: Diagnosis not present

## 2020-03-18 DIAGNOSIS — K111 Hypertrophy of salivary gland: Secondary | ICD-10-CM | POA: Diagnosis not present

## 2020-03-21 DIAGNOSIS — K115 Sialolithiasis: Secondary | ICD-10-CM | POA: Diagnosis not present

## 2020-03-21 DIAGNOSIS — Z87891 Personal history of nicotine dependence: Secondary | ICD-10-CM | POA: Diagnosis not present

## 2020-03-21 DIAGNOSIS — Z79899 Other long term (current) drug therapy: Secondary | ICD-10-CM | POA: Diagnosis not present

## 2020-03-21 DIAGNOSIS — I639 Cerebral infarction, unspecified: Secondary | ICD-10-CM | POA: Diagnosis not present

## 2020-03-21 DIAGNOSIS — M159 Polyosteoarthritis, unspecified: Secondary | ICD-10-CM | POA: Diagnosis not present

## 2020-03-21 DIAGNOSIS — Z888 Allergy status to other drugs, medicaments and biological substances status: Secondary | ICD-10-CM | POA: Diagnosis not present

## 2020-03-21 DIAGNOSIS — I10 Essential (primary) hypertension: Secondary | ICD-10-CM | POA: Diagnosis not present

## 2020-03-24 DIAGNOSIS — Z01812 Encounter for preprocedural laboratory examination: Secondary | ICD-10-CM | POA: Diagnosis not present

## 2020-03-24 DIAGNOSIS — K115 Sialolithiasis: Secondary | ICD-10-CM | POA: Diagnosis not present

## 2020-03-24 DIAGNOSIS — Z01818 Encounter for other preprocedural examination: Secondary | ICD-10-CM | POA: Diagnosis not present

## 2020-03-24 DIAGNOSIS — Z20822 Contact with and (suspected) exposure to covid-19: Secondary | ICD-10-CM | POA: Diagnosis not present

## 2020-03-24 DIAGNOSIS — Z91041 Radiographic dye allergy status: Secondary | ICD-10-CM | POA: Diagnosis not present

## 2020-03-24 DIAGNOSIS — Z87891 Personal history of nicotine dependence: Secondary | ICD-10-CM | POA: Diagnosis not present

## 2020-03-27 DIAGNOSIS — E119 Type 2 diabetes mellitus without complications: Secondary | ICD-10-CM | POA: Diagnosis not present

## 2020-03-27 DIAGNOSIS — Z20822 Contact with and (suspected) exposure to covid-19: Secondary | ICD-10-CM | POA: Diagnosis not present

## 2020-03-27 DIAGNOSIS — K115 Sialolithiasis: Secondary | ICD-10-CM | POA: Diagnosis not present

## 2020-03-27 DIAGNOSIS — Z01812 Encounter for preprocedural laboratory examination: Secondary | ICD-10-CM | POA: Diagnosis not present

## 2020-03-27 DIAGNOSIS — Z8673 Personal history of transient ischemic attack (TIA), and cerebral infarction without residual deficits: Secondary | ICD-10-CM | POA: Insufficient documentation

## 2020-04-03 DIAGNOSIS — Z7984 Long term (current) use of oral hypoglycemic drugs: Secondary | ICD-10-CM | POA: Diagnosis not present

## 2020-04-03 DIAGNOSIS — K115 Sialolithiasis: Secondary | ICD-10-CM | POA: Diagnosis not present

## 2020-04-03 DIAGNOSIS — Z20822 Contact with and (suspected) exposure to covid-19: Secondary | ICD-10-CM | POA: Diagnosis not present

## 2020-04-03 DIAGNOSIS — Z87891 Personal history of nicotine dependence: Secondary | ICD-10-CM | POA: Diagnosis not present

## 2020-04-03 DIAGNOSIS — Z539 Procedure and treatment not carried out, unspecified reason: Secondary | ICD-10-CM | POA: Diagnosis not present

## 2020-04-03 DIAGNOSIS — E119 Type 2 diabetes mellitus without complications: Secondary | ICD-10-CM | POA: Diagnosis not present

## 2020-04-22 DIAGNOSIS — I639 Cerebral infarction, unspecified: Secondary | ICD-10-CM | POA: Diagnosis not present

## 2020-04-22 DIAGNOSIS — I10 Essential (primary) hypertension: Secondary | ICD-10-CM | POA: Diagnosis not present

## 2020-04-22 DIAGNOSIS — M159 Polyosteoarthritis, unspecified: Secondary | ICD-10-CM | POA: Diagnosis not present

## 2020-05-22 DIAGNOSIS — M159 Polyosteoarthritis, unspecified: Secondary | ICD-10-CM | POA: Diagnosis not present

## 2020-05-22 DIAGNOSIS — I10 Essential (primary) hypertension: Secondary | ICD-10-CM | POA: Diagnosis not present

## 2020-05-22 DIAGNOSIS — I639 Cerebral infarction, unspecified: Secondary | ICD-10-CM | POA: Diagnosis not present

## 2020-07-21 ENCOUNTER — Ambulatory Visit (INDEPENDENT_AMBULATORY_CARE_PROVIDER_SITE_OTHER): Payer: Medicare Other | Admitting: Cardiovascular Disease

## 2020-07-21 ENCOUNTER — Encounter (INDEPENDENT_AMBULATORY_CARE_PROVIDER_SITE_OTHER): Payer: Self-pay

## 2020-07-21 ENCOUNTER — Encounter: Payer: Self-pay | Admitting: Cardiovascular Disease

## 2020-07-21 ENCOUNTER — Other Ambulatory Visit: Payer: Self-pay

## 2020-07-21 ENCOUNTER — Other Ambulatory Visit: Payer: Self-pay | Admitting: *Deleted

## 2020-07-21 VITALS — BP 156/70 | HR 75 | Ht 70.0 in | Wt 254.0 lb

## 2020-07-21 DIAGNOSIS — Z01812 Encounter for preprocedural laboratory examination: Secondary | ICD-10-CM

## 2020-07-21 DIAGNOSIS — I200 Unstable angina: Secondary | ICD-10-CM

## 2020-07-21 LAB — BASIC METABOLIC PANEL
BUN/Creatinine Ratio: 14 (ref 10–24)
BUN: 14 mg/dL (ref 8–27)
CO2: 27 mmol/L (ref 20–29)
Calcium: 9.9 mg/dL (ref 8.6–10.2)
Chloride: 102 mmol/L (ref 96–106)
Creatinine, Ser: 0.98 mg/dL (ref 0.76–1.27)
Glucose: 153 mg/dL — ABNORMAL HIGH (ref 65–99)
Potassium: 3.8 mmol/L (ref 3.5–5.2)
Sodium: 138 mmol/L (ref 134–144)
eGFR: 83 mL/min/{1.73_m2} (ref >59)

## 2020-07-21 LAB — CBC
Hematocrit: 32.6 % — ABNORMAL LOW (ref 37.5–51.0)
Hemoglobin: 10.4 g/dL — ABNORMAL LOW (ref 13.0–17.7)
MCH: 24.3 pg — ABNORMAL LOW (ref 26.6–33.0)
MCHC: 31.9 g/dL (ref 31.5–35.7)
MCV: 76 fL — ABNORMAL LOW (ref 79–97)
Platelets: 95 10*3/uL — CL (ref 150–450)
RBC: 4.28 x10E6/uL (ref 4.14–5.80)
RDW: 18.9 % — ABNORMAL HIGH (ref 11.6–15.4)
WBC: 5.4 10*3/uL (ref 3.4–10.8)

## 2020-07-21 MED ORDER — PREDNISONE 50 MG PO TABS: ORAL_TABLET | ORAL | 0 refills | Status: DC

## 2020-07-21 NOTE — Progress Notes (Signed)
Chief Complaint  Patient presents with  . New Patient (Initial Visit)    Chest pain   History of Present Illness: 70 yo male with history of diabetes mellitus, chest pain, gout, GERD, HTN, hyperlipidemia, sleep apnea on CPAP and prior stroke who is here today as a new patient for evaluation of chest pain. He had a cardiac catheterization in 2006 with no evidence of CAD. Echo February 2014 with LVEF=55-60%, no valve disease. He has been having chest pressure for several weeks. This is in the center of his chest. There is associated dyspnea and left arm tingling. His energy level has been down over the past few weeks. He was seen in primary care and had a stress test arranged. Nuclear stress test at [facility] 07/18/20 with no evidence of ischemia. LVEF=65%. His former daughter-in-law works in our cath lab [employer]). She was called today by his family requesting a visit in our office.   Primary Care Physician: Monico Blitz, MD   Past Medical History:  Diagnosis Date  . Chest pain    a. 10.2006 Cath: nl cors, EF 65%;  b. 12/2008 Negative myoview.  . DDD (degenerative disc disease), cervical    a. 08/2008 s/p R L4-5 diskectomy, foaminotomy, and lysis of adhesions.  . Diabetes ([place])   . GERD (gastroesophageal reflux disease)   . Gout   . History of kidney stones   . Hyperlipidemia   . Hypertension   . OSA (obstructive sleep apnea)    a. on cpap  NOT ABLE TO USE  . Palpitations    a. 11/2008 - nl holter monitor;  b. 04/2010 Echo: EF 50-55%, Gr 1 DD.  . Skin cancer   . Stroke [facility])    a. 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    Past Surgical History:  Procedure Laterality Date  . APPENDECTOMY    . BACK SURGERY    . CATARACT EXTRACTION     RIGHT  . FOOT SURGERY    . HEMORROIDECTOMY    . KNEE SURGERY    . LEFT HEART CATHETERIZATION WITH CORONARY ANGIOGRAM N/A 07/18/2012   Procedure: LEFT HEART CATHETERIZATION WITH CORONARY ANGIOGRAM;  Surgeon: Burnell Blanks,  MD;  Location: [facility] CATH LAB;  Service: Cardiovascular;  Laterality: N/A;  . LUMBAR LAMINECTOMY/DECOMPRESSION MICRODISCECTOMY Left 01/27/2016   Procedure: Left Lumbar Four-Five, Lumbar FIve-Sacral One Laminectomy/Foraminotomy;  Surgeon: Leeroy Cha, MD;  Location: [facility] NEURO ORS;  Service: Neurosurgery;  Laterality: Left;  Left L4-5 L5-S1 Laminectomy/Foraminotomy  . TONSILLECTOMY      Current Outpatient Medications  Medication Sig Dispense Refill  . allopurinol (ZYLOPRIM) 300 MG tablet Take 300 mg by mouth every evening.    Marland Kitchen aspirin EC 81 MG tablet Take 81 mg by mouth every evening.    . fexofenadine (ALLEGRA) 180 MG tablet Take 180 mg by mouth daily as needed (allergies).    . metFORMIN (GLUCOPHAGE) 500 MG tablet Take 1 tablet by mouth in the morning and at bedtime.    . metoprolol succinate (TOPROL-XL) 50 MG 24 hr tablet Take 1 tablet by mouth daily.    Marland Kitchen omeprazole (PRILOSEC) 20 MG capsule Take 20 mg by mouth every evening.    . predniSONE (DELTASONE) 50 MG tablet Take as directed. 3 tablet 0  . VITAMIN D PO Take 1,000 mg by mouth daily.     No current facility-administered medications for this visit.    Allergies  Allergen Reactions  . Contrast Media [Iodinated Diagnostic Agents] Other (See  Comments)    STROKE  . Gadolinium Other (See Comments)     Code: VOM, Desc: unusual reaction, patient was nauseous, lost consciousness, Onset Date: 05697948     Social History   Socioeconomic History  . Marital status: Married    Spouse name: Not on file  . Number of children: 1  . Years of education: Not on file  . Highest education level: Not on file  Occupational History  . 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 Smoking status: Former Smoker    Types: Cigarettes    Quit date: 05/25/1983    Years since quitting: 37.1  . Smokeless tobacco: Never Used  . Tobacco comment: quit smoking in 1986  Substance and Sexual Activity  . Alcohol use: No    Comment: former - quit  driking in 1983.  . Drug use: No  . Sexual activity: Not on file  Other Topics Concern  . Not on file  Social History Narrative   Lives in [place], [place] with spouse.  Works as a Administrator.   Social Determinants of Health   Financial Resource Strain: Not on file  Food Insecurity: Not on file  Transportation Needs: Not on file  Physical Activity: Not on file  Stress: Not on file  Social Connections: Not on file  Intimate Partner Violence: Not on file    Family History  Problem Relation Age of Onset  . Cancer Mother   . Heart attack Father     Review of Systems:  As stated in the HPI and otherwise negative.   BP (!) 156/70   Pulse 75   Ht 5\' 10"  (1.778 m)   Wt 254 lb (115.2 kg)   SpO2 98%   BMI 36.45 kg/m   Physical Examination: General: Well developed, well nourished, NAD  HEENT: OP clear, mucus membranes moist  SKIN: warm, dry. No rashes. Neuro: No focal deficits  Musculoskeletal: Muscle strength 5/5 all ext  Psychiatric: Mood and affect normal  Neck: No JVD, no carotid bruits, no thyromegaly, no lymphadenopathy.  Lungs:Clear bilaterally, no wheezes, rhonci, crackles Cardiovascular: Regular rate and rhythm. No murmurs, gallops or rubs. Abdomen:Soft. Bowel sounds present. Non-tender.  Extremities: No lower extremity edema. Pulses are 2 + in the bilateral DP/PT.  EKG:  EKG is ordered today. The ekg ordered today demonstrates NSR, no ischemic changes  Recent Labs: No results found for requested labs within last 8760 hours.   Lipid Panel    Component Value Date/Time   CHOL 79 07/18/2012 0440   TRIG 136 07/18/2012 0440   HDL 27 (L) 07/18/2012 0440   CHOLHDL 2.9 07/18/2012 0440   VLDL 27 07/18/2012 0440   LDLCALC 25 07/18/2012 0440     Wt Readings from Last 3 Encounters:  07/21/20 254 lb (115.2 kg)  01/27/16 240 lb 9 oz (109.1 kg)  01/20/16 240 lb 9 oz (109.1 kg)      Assessment and Plan:   1. Chest pain/Unstable angina: His symptoms are concerning  for angina. Nuclear stress is low risk but I think we need to arrange a right and left heart catheterization to assess filling pressures and exclude CAD. Right and left heart cath at [facility] 07/25/20 at 9am. Will arrange pre-treatment for contrast dye allergy. Echo to assess LVEF and exclude structural heart disease.   Current medicines are reviewed at length with the patient today.  The patient does not have concerns regarding medicines.  The following changes have been made:  no change  Labs/ tests ordered today include:   Orders Placed This Encounter  Procedures  . CBC  . Basic metabolic panel  . EKG 12-Lead  . ECHOCARDIOGRAM COMPLETE     Disposition:   FU with me in 4 weeks.    Signed, Lauree Chandler, MD 07/21/2020 4:27 PM    [address], [place], [place]  [address] Phone: [phone]; Fax: [phone]

## 2020-07-21 NOTE — Patient Instructions (Addendum)
Medication Instructions:  NO CHANGES TODAY *If you need a refill on your cardiac medications before your next appointment, please call your pharmacy*   Lab Work: TODAY: CBC, BMET If you have labs (blood work) drawn today and your tests are completely normal, you will receive your results only by: Marland Kitchen MyChart Message (if you have MyChart) OR . A paper copy in the mail If you have any lab test that is abnormal or we need to change your treatment, we will call you to review the results.   Testing/Procedures: Your physician has requested that you have an echocardiogram. Echocardiography is a painless test that uses sound waves to create images of your heart. It provides your doctor with information about the size and shape of your heart and how well your heart's chambers and valves are working. This procedure takes approximately one hour. There are no restrictions for this procedure.  Your physician has requested that you have a cardiac catheterization. Cardiac catheterization is used to diagnose and/or treat various heart conditions. Doctors may recommend this procedure for a number of different reasons. The most common reason is to evaluate chest pain. Chest pain can be a symptom of coronary artery disease (CAD), and cardiac catheterization can show whether plaque is narrowing or blocking your heart's arteries. This procedure is also used to evaluate the valves, as well as measure the blood flow and oxygen levels in different parts of your heart. For further information please visit [email or web address]. Please follow instruction sheet, as given.  Follow-Up: At [facility], you and your health needs are our priority.  As part of our continuing mission to provide you with exceptional heart care, we have created designated Provider Care Teams.  These Care Teams include your primary Cardiologist (physician) and Advanced Practice Providers (APPs -  Physician Assistants and Nurse Practitioners) who all  work together to provide you with the care you need, when you need it.  We recommend signing up for the patient portal called "MyChart".  Sign up information is provided on this After Visit Summary.  MyChart is used to connect with patients for Virtual Visits (Telemedicine).  Patients are able to view lab/test results, encounter notes, upcoming appointments, etc.  Non-urgent messages can be sent to your provider as well.   To learn more about what you can do with MyChart, go to [email or web address].    Your next appointment:   approx 4  week(s)  The format for your next appointment:   In Person  Provider:   Lauree Chandler, MD   Other Instructions    [place] OFFICE [place], [place] [address] Dept: [phone] Loc: [place]  07/21/2020  You are scheduled for a Cardiac Catheterization on Friday, March 4 with Dr. Lauree Chandler.  1. Please arrive at the [facility] (Main Entrance A) at [facility]: [address] [place], [place] [address] at 7:00 AM (This time is two hours before your procedure to ensure your preparation). Free valet parking service is available.   Special note: Every effort is made to have your procedure done on time. Please understand that emergencies sometimes delay scheduled procedures.  2. Diet: Do not eat solid foods after midnight.  The patient may have clear liquids until 5am upon the day of the procedure.  3. Labs: You will need to have blood drawn on Monday, February 28 at [facility] at [facility]. [address]  Open: 7:30am - 5pm    Phone: [phone]. You do not need to be fasting.  Due to recent COVID-19 restrictions implemented by our local and state authorities and in an effort to keep both patients and staff as safe as possible, our hospital system requires COVID-19 testing  prior to certain scheduled hospital procedures.  Please go to [address]. [place], [place] [address] on 07/23/20 at 3:00 pm  .  This is a drive up testing site.  You will not need to exit your vehicle.  You will not be billed at the time of testing but may receive a bill later depending on your insurance. You must agree to self-quarantine from the time of your testing until the procedure date on 07/25/20.  This should included staying home with ONLY the people you live with.  Avoid take-out, grocery store shopping or leaving the house for any non-emergent reason.  Failure to have your COVID-19 test done on the date and time you have been scheduled will result in cancellation of your procedure.  Please call our office at [phone] if you have any questions.  4. Medication instructions in preparation for your procedure:   Contrast Allergy: Yes, Please take Prednisone 50mg  by mouth at: Thirteen hours prior to cath 8:00pm on Thursday Seven hours prior to cath 2:00am on Friday And prior to leaving home please take last dose of Prednisone 50mg  and Benadryl 50mg  by mouth.   Do not take Diabetes Med Glucophage (Metformin) on the day of the procedure and HOLD 48 HOURS AFTER THE PROCEDURE.  On the morning of your procedure, take your Aspirin and any morning medicines NOT listed above.  You may use sips of water.  5. Plan for one night stay--bring personal belongings. 6. Bring a current list of your medications and current insurance cards. 7. You MUST have a responsible person to drive you home. 8. Someone MUST be with you the first 24 hours after you arrive home or your discharge will be delayed. 9. Please wear clothes that are easy to get on and off and wear slip-on shoes.  Thank you for allowing [place] to care for you!   -- [place] [facility]

## 2020-07-21 NOTE — H&P (View-Only) (Signed)
Chief Complaint  Patient presents with  . New Patient (Initial Visit)    Chest pain   History of Present Illness: 70 yo male with history of diabetes mellitus, chest pain, gout, GERD, HTN, hyperlipidemia, sleep apnea on CPAP and prior stroke who is here today as a new patient for evaluation of chest pain. He had a cardiac catheterization in 2006 with no evidence of CAD. Echo February 2014 with LVEF=55-60%, no valve disease. He has been having chest pressure for several weeks. This is in the center of his chest. There is associated dyspnea and left arm tingling. His energy level has been down over the past few weeks. He was seen in primary care and had a stress test arranged. Nuclear stress test at [facility] 07/18/20 with no evidence of ischemia. LVEF=65%. His former daughter-in-law works in our cath lab [employer]). She was called today by his family requesting a visit in our office.   Primary Care Physician: Monico Blitz, MD   Past Medical History:  Diagnosis Date  . Chest pain    a. 10.2006 Cath: nl cors, EF 65%;  b. 12/2008 Negative myoview.  . DDD (degenerative disc disease), cervical    a. 08/2008 s/p R L4-5 diskectomy, foaminotomy, and lysis of adhesions.  . Diabetes ([place])   . GERD (gastroesophageal reflux disease)   . Gout   . History of kidney stones   . Hyperlipidemia   . Hypertension   . OSA (obstructive sleep apnea)    a. on cpap  NOT ABLE TO USE  . Palpitations    a. 11/2008 - nl holter monitor;  b. 04/2010 Echo: EF 50-55%, Gr 1 DD.  . Skin cancer   . Stroke [facility])    a. 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    Past Surgical History:  Procedure Laterality Date  . APPENDECTOMY    . BACK SURGERY    . CATARACT EXTRACTION     RIGHT  . FOOT SURGERY    . HEMORROIDECTOMY    . KNEE SURGERY    . LEFT HEART CATHETERIZATION WITH CORONARY ANGIOGRAM N/A 07/18/2012   Procedure: LEFT HEART CATHETERIZATION WITH CORONARY ANGIOGRAM;  Surgeon: Burnell Blanks,  MD;  Location[facility] CATH LAB;  Service: Cardiovascular;  Laterality: N/A;  . LUMBAR LAMINECTOMY/DECOMPRESSION MICRODISCECTOMY Left 01/27/2016   Procedure: Left Lumbar Four-Five, Lumbar FIve-Sacral One Laminectomy/Foraminotomy;  Surgeon: Leeroy Cha, MD;  Location: [place] NEURO ORS;  Service: Neurosurgery;  Laterality: Left;  Left L4-5 L5-S1 Laminectomy/Foraminotomy  . TONSILLECTOMY      Current Outpatient Medications  Medication Sig Dispense Refill  . allopurinol (ZYLOPRIM) 300 MG tablet Take 300 mg by mouth every evening.    Marland Kitchen aspirin EC 81 MG tablet Take 81 mg by mouth every evening.    . fexofenadine (ALLEGRA) 180 MG tablet Take 180 mg by mouth daily as needed (allergies).    . metFORMIN (GLUCOPHAGE) 500 MG tablet Take 1 tablet by mouth in the morning and at bedtime.    . metoprolol succinate (TOPROL-XL) 50 MG 24 hr tablet Take 1 tablet by mouth daily.    Marland Kitchen omeprazole (PRILOSEC) 20 MG capsule Take 20 mg by mouth every evening.    . predniSONE (DELTASONE) 50 MG tablet Take as directed. 3 tablet 0  . VITAMIN D PO Take 1,000 mg by mouth daily.     No current facility-administered medications for this visit.    Allergies  Allergen Reactions  . Contrast Media [Iodinated Diagnostic Agents] Other (See  Comments)    STROKE  . Gadolinium Other (See Comments)     Code: VOM, Desc: unusual reaction, patient was nauseous, lost consciousness, Onset Date: 12878676     Social History   Socioeconomic History  . Marital status: Married    Spouse name: Not on file  . Number of children: 1  . Years of education: Not on file  . Highest education level: Not on file  Occupational History  . 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 Smoking status: Former Smoker    Types: Cigarettes    Quit date: 05/25/1983    Years since quitting: 37.1  . Smokeless tobacco: Never Used  . Tobacco comment: quit smoking in 1986  Substance and Sexual Activity  . Alcohol use: No    Comment: former - quit  driking in 1983.  . Drug use: No  . Sexual activity: Not on file  Other Topics Concern  . Not on file  Social History Narrative   Lives in [place], [place] with spouse.  Works as a Administrator.   Social Determinants of Health   Financial Resource Strain: Not on file  Food Insecurity: Not on file  Transportation Needs: Not on file  Physical Activity: Not on file  Stress: Not on file  Social Connections: Not on file  Intimate Partner Violence: Not on file    Family History  Problem Relation Age of Onset  . Cancer Mother   . Heart attack Father     Review of Systems:  As stated in the HPI and otherwise negative.   BP (!) 156/70   Pulse 75   Ht 5\' 10"  (1.778 m)   Wt 254 lb (115.2 kg)   SpO2 98%   BMI 36.45 kg/m   Physical Examination: General: Well developed, well nourished, NAD  HEENT: OP clear, mucus membranes moist  SKIN: warm, dry. No rashes. Neuro: No focal deficits  Musculoskeletal: Muscle strength 5/5 all ext  Psychiatric: Mood and affect normal  Neck: No JVD, no carotid bruits, no thyromegaly, no lymphadenopathy.  Lungs:Clear bilaterally, no wheezes, rhonci, crackles Cardiovascular: Regular rate and rhythm. No murmurs, gallops or rubs. Abdomen:Soft. Bowel sounds present. Non-tender.  Extremities: No lower extremity edema. Pulses are 2 + in the bilateral DP/PT.  EKG:  EKG is ordered today. The ekg ordered today demonstrates NSR, no ischemic changes  Recent Labs: No results found for requested labs within last 8760 hours.   Lipid Panel    Component Value Date/Time   CHOL 79 07/18/2012 0440   TRIG 136 07/18/2012 0440   HDL 27 (L) 07/18/2012 0440   CHOLHDL 2.9 07/18/2012 0440   VLDL 27 07/18/2012 0440   LDLCALC 25 07/18/2012 0440     Wt Readings from Last 3 Encounters:  07/21/20 254 lb (115.2 kg)  01/27/16 240 lb 9 oz (109.1 kg)  01/20/16 240 lb 9 oz (109.1 kg)      Assessment and Plan:   1. Chest pain/Unstable angina: His symptoms are concerning  for angina. Nuclear stress is low risk but I think we need to arrange a right and left heart catheterization to assess filling pressures and exclude CAD. Right and left heart cath at [facility] 07/25/20 at 9am. Will arrange pre-treatment for contrast dye allergy. Echo to assess LVEF and exclude structural heart disease.   Current medicines are reviewed at length with the patient today.  The patient does not have concerns regarding medicines.  The following changes have been made:  no change  Labs/ tests ordered today include:   Orders Placed This Encounter  Procedures  . CBC  . Basic metabolic panel  . EKG 12-Lead  . ECHOCARDIOGRAM COMPLETE     Disposition:   FU with me in 4 weeks.    Signed, Lauree Chandler, MD 07/21/2020 4:27 PM    [address] [place] [address], [place], [place]  [address] Phone: [phone]; Fax: [phone]

## 2020-07-22 ENCOUNTER — Telehealth: Payer: Self-pay

## 2020-07-22 NOTE — Telephone Encounter (Signed)
Thank you :)

## 2020-07-22 NOTE — Telephone Encounter (Signed)
Spoke with pt and advised of low Hgb and platelets per Dr Angelena Form.  Pt denies active bleeding or dark/black stools.  Pt states he does have SOB at times but feels that is associated with anxiety.  Pt advised if no signs/Sx of active bleeding Dr Angelena Form will proceed with cath as scheduled.  Pt verbalizes understanding and agrees with current plan.

## 2020-07-22 NOTE — Telephone Encounter (Signed)
-----   Message from Burnell Blanks, MD sent at 07/22/2020  8:29 AM EST ----- Pre-cath labs came back and he is anemic which is new compared to his Hgb from 2017. He has chronically low platelets. Can we check with him and make sure he is having no bleeding, dark stools or other blood loss? If there are no active signs of bleeding, I can proceed with his cath Friday. He may be feeling weak and dyspneic from his anemia. Thanks, Gerald Stabs

## 2020-07-23 ENCOUNTER — Other Ambulatory Visit (HOSPITAL_COMMUNITY)
Admission: RE | Admit: 2020-07-23 | Discharge: 2020-07-23 | Disposition: A | Discharge status: Home / Self Care | Payer: Medicare Other | Source: Ambulatory Visit | Attending: Cardiovascular Disease | Admitting: Cardiovascular Disease

## 2020-07-23 DIAGNOSIS — Z01812 Encounter for preprocedural laboratory examination: Secondary | ICD-10-CM | POA: Diagnosis present

## 2020-07-23 DIAGNOSIS — Z20822 Contact with and (suspected) exposure to covid-19: Secondary | ICD-10-CM | POA: Diagnosis not present

## 2020-07-23 LAB — SARS CORONAVIRUS 2 (TAT 6-24 HRS): SARS Coronavirus 2: NEGATIVE

## 2020-07-24 ENCOUNTER — Telehealth: Payer: Self-pay | Admitting: *Deleted

## 2020-07-24 NOTE — Telephone Encounter (Signed)
Pt contacted pre-catheterization scheduled at [facility] for: Friday July 25, 2020 9 AM Verified arrival time and place: [facility]) at: 7 AM   No solid food after midnight prior to cath, clear liquids until 5 AM day of procedure.  Contrast Allergy: yes 13 hour Prednisone and Benadryl Prep reviewed with patient: 07/24/20 Prednisone 50 mg 8 PM 07/25/20 Prednisone 50 mg 2 AM 07/25/20 Prednisone 50 mg and Benadryl 50 mg just prior to leaving home for hospital AM of procedure.  Hold: Metformin-day of procedure and 48 hours post procedure   Except hold medications AM meds can be  taken pre-cath with sips of water including: ASA 81 mg Prednisone 50 mg Benadryl 50 mg  Confirmed patient has responsible adult to drive home post procedure and be with patient first 24 hours after arriving home: yes  You are allowed ONE visitor in the waiting room during the time you are at the hospital for your procedure. Both you and your visitor must wear a mask once you enter the hospital.    Reviewed procedure/mask/visitor instructions with patient.

## 2020-07-25 ENCOUNTER — Encounter (HOSPITAL_COMMUNITY): Payer: Self-pay | Admitting: Cardiovascular Disease

## 2020-07-25 ENCOUNTER — Ambulatory Visit (HOSPITAL_COMMUNITY)
Admission: RE | Admit: 2020-07-25 | Discharge: 2020-07-25 | Disposition: A | Discharge status: Home / Self Care | Payer: Medicare Other | Attending: Cardiovascular Disease | Admitting: Cardiovascular Disease

## 2020-07-25 ENCOUNTER — Encounter (HOSPITAL_COMMUNITY)
Admission: RE | Disposition: A | Discharge status: Home / Self Care | Payer: Self-pay | Source: Home / Self Care | Attending: Cardiovascular Disease

## 2020-07-25 ENCOUNTER — Other Ambulatory Visit: Payer: Self-pay

## 2020-07-25 DIAGNOSIS — I25110 Atherosclerotic heart disease of native coronary artery with unstable angina pectoris: Secondary | ICD-10-CM | POA: Diagnosis not present

## 2020-07-25 DIAGNOSIS — Z7984 Long term (current) use of oral hypoglycemic drugs: Secondary | ICD-10-CM | POA: Insufficient documentation

## 2020-07-25 DIAGNOSIS — E119 Type 2 diabetes mellitus without complications: Secondary | ICD-10-CM | POA: Insufficient documentation

## 2020-07-25 DIAGNOSIS — Z79899 Other long term (current) drug therapy: Secondary | ICD-10-CM | POA: Insufficient documentation

## 2020-07-25 DIAGNOSIS — I10 Essential (primary) hypertension: Secondary | ICD-10-CM | POA: Diagnosis not present

## 2020-07-25 DIAGNOSIS — R0600 Dyspnea, unspecified: Secondary | ICD-10-CM | POA: Diagnosis present

## 2020-07-25 DIAGNOSIS — E785 Hyperlipidemia, unspecified: Secondary | ICD-10-CM | POA: Insufficient documentation

## 2020-07-25 DIAGNOSIS — G4733 Obstructive sleep apnea (adult) (pediatric): Secondary | ICD-10-CM | POA: Insufficient documentation

## 2020-07-25 DIAGNOSIS — Z8249 Family history of ischemic heart disease and other diseases of the circulatory system: Secondary | ICD-10-CM | POA: Insufficient documentation

## 2020-07-25 DIAGNOSIS — Z85828 Personal history of other malignant neoplasm of skin: Secondary | ICD-10-CM | POA: Insufficient documentation

## 2020-07-25 DIAGNOSIS — I200 Unstable angina: Secondary | ICD-10-CM

## 2020-07-25 DIAGNOSIS — Z87891 Personal history of nicotine dependence: Secondary | ICD-10-CM | POA: Insufficient documentation

## 2020-07-25 DIAGNOSIS — Z7982 Long term (current) use of aspirin: Secondary | ICD-10-CM | POA: Diagnosis not present

## 2020-07-25 DIAGNOSIS — Z8673 Personal history of transient ischemic attack (TIA), and cerebral infarction without residual deficits: Secondary | ICD-10-CM | POA: Diagnosis not present

## 2020-07-25 DIAGNOSIS — Z91041 Radiographic dye allergy status: Secondary | ICD-10-CM | POA: Diagnosis not present

## 2020-07-25 DIAGNOSIS — R0609 Other forms of dyspnea: Secondary | ICD-10-CM

## 2020-07-25 HISTORY — PX: RIGHT/LEFT HEART CATH AND CORONARY ANGIOGRAPHY: CATH118266

## 2020-07-25 LAB — GLUCOSE, CAPILLARY
Glucose-Capillary: 253 mg/dL — ABNORMAL HIGH (ref 70–99)
Glucose-Capillary: 217 mg/dL — ABNORMAL HIGH (ref 70–99)

## 2020-07-25 LAB — D-DIMER, QUANTITATIVE: D-Dimer, Quant: 0.3 ug/mL-FEU (ref 0.00–0.50)

## 2020-07-25 SURGERY — RIGHT/LEFT HEART CATH AND CORONARY ANGIOGRAPHY
Anesthesia: LOCAL

## 2020-07-25 MED ORDER — SODIUM CHLORIDE 0.9 % IV SOLN: 250.0000 mL | INTRAVENOUS | Status: DC | PRN

## 2020-07-25 MED ORDER — SODIUM CHLORIDE 0.9% FLUSH: 3.0000 mL | INTRAVENOUS | Status: DC | PRN

## 2020-07-25 MED ORDER — SODIUM CHLORIDE 0.9% FLUSH: 3.0000 mL | Freq: Two times a day (BID) | INTRAVENOUS | Status: DC

## 2020-07-25 MED ORDER — IOHEXOL 350 MG/ML SOLN
INTRAVENOUS | Status: DC | PRN
  Administered 2020-07-25: 85 mL

## 2020-07-25 MED ORDER — FENTANYL CITRATE (PF) 100 MCG/2ML IJ SOLN
INTRAMUSCULAR | Status: DC | PRN
  Administered 2020-07-25: 50 ug via INTRAVENOUS

## 2020-07-25 MED ORDER — ASPIRIN 81 MG PO CHEW
81.0000 mg | CHEWABLE_TABLET | ORAL | Status: AC
  Administered 2020-07-25: 81 mg via ORAL
  Filled 2020-07-25: qty 1

## 2020-07-25 MED ORDER — MIDAZOLAM HCL 2 MG/2ML IJ SOLN
INTRAMUSCULAR | Status: DC | PRN
  Administered 2020-07-25: 2 mg via INTRAVENOUS

## 2020-07-25 MED ORDER — LIDOCAINE HCL (PF) 1 % IJ SOLN
INTRAMUSCULAR | Status: AC
  Filled 2020-07-25: qty 30

## 2020-07-25 MED ORDER — VERAPAMIL HCL 2.5 MG/ML IV SOLN
INTRAVENOUS | Status: DC | PRN
  Administered 2020-07-25: 10 mL via INTRA_ARTERIAL

## 2020-07-25 MED ORDER — SODIUM CHLORIDE 0.9 % WEIGHT BASED INFUSION: 1.0000 mL/kg/h | INTRAVENOUS | Status: DC

## 2020-07-25 MED ORDER — VERAPAMIL HCL 2.5 MG/ML IV SOLN
INTRAVENOUS | Status: AC
  Filled 2020-07-25: qty 2

## 2020-07-25 MED ORDER — ACETAMINOPHEN 325 MG PO TABS: 650.0000 mg | ORAL_TABLET | ORAL | Status: DC | PRN

## 2020-07-25 MED ORDER — HYDRALAZINE HCL 20 MG/ML IJ SOLN: 10.0000 mg | INTRAMUSCULAR | Status: DC | PRN

## 2020-07-25 MED ORDER — SODIUM CHLORIDE 0.9 % WEIGHT BASED INFUSION
3.0000 mL/kg/h | INTRAVENOUS | Status: AC
  Administered 2020-07-25: 3 mL/kg/h via INTRAVENOUS

## 2020-07-25 MED ORDER — HEPARIN SODIUM (PORCINE) 1000 UNIT/ML IJ SOLN
INTRAMUSCULAR | Status: DC | PRN
  Administered 2020-07-25: 6000 [IU] via INTRAVENOUS

## 2020-07-25 MED ORDER — SODIUM CHLORIDE 0.9 % IV SOLN: INTRAVENOUS | Status: AC

## 2020-07-25 MED ORDER — ONDANSETRON HCL 4 MG/2ML IJ SOLN: 4.0000 mg | Freq: Four times a day (QID) | INTRAMUSCULAR | Status: DC | PRN

## 2020-07-25 MED ORDER — LABETALOL HCL 5 MG/ML IV SOLN: 10.0000 mg | INTRAVENOUS | Status: DC | PRN

## 2020-07-25 MED ORDER — FENTANYL CITRATE (PF) 100 MCG/2ML IJ SOLN
INTRAMUSCULAR | Status: AC
  Filled 2020-07-25: qty 2

## 2020-07-25 MED ORDER — HEPARIN (PORCINE) IN NACL 1000-0.9 UT/500ML-% IV SOLN
INTRAVENOUS | Status: DC | PRN
  Administered 2020-07-25 (×2): 500 mL

## 2020-07-25 MED ORDER — MIDAZOLAM HCL 2 MG/2ML IJ SOLN
INTRAMUSCULAR | Status: AC
  Filled 2020-07-25: qty 2

## 2020-07-25 MED ORDER — HEPARIN SODIUM (PORCINE) 1000 UNIT/ML IJ SOLN
INTRAMUSCULAR | Status: AC
  Filled 2020-07-25: qty 1

## 2020-07-25 MED ORDER — LIDOCAINE HCL (PF) 1 % IJ SOLN
INTRAMUSCULAR | Status: DC | PRN
  Administered 2020-07-25: 5 mL

## 2020-07-25 MED ORDER — HEPARIN (PORCINE) IN NACL 1000-0.9 UT/500ML-% IV SOLN
INTRAVENOUS | Status: AC
  Filled 2020-07-25: qty 1000

## 2020-07-25 SURGICAL SUPPLY — 13 items
CATH 5FR JL3.5 JR4 ANG PIG MP (CATHETERS) ×1 IMPLANT
CATH BALLN WEDGE 5F 110CM (CATHETERS) ×1 IMPLANT
DEVICE RAD COMP TR BAND LRG (VASCULAR PRODUCTS) ×1 IMPLANT
GLIDESHEATH SLEND SS 6F .021 (SHEATH) ×2 IMPLANT
GUIDEWIRE .025 260CM (WIRE) ×1 IMPLANT
GUIDEWIRE INQWIRE 1.5J.035X260 (WIRE) IMPLANT
INQWIRE 1.5J .035X260CM (WIRE) ×2
KIT HEART LEFT (KITS) ×2 IMPLANT
PACK CARDIAC CATHETERIZATION (CUSTOM PROCEDURE TRAY) ×2 IMPLANT
SHEATH GLIDE SLENDER 4/5FR (SHEATH) ×2 IMPLANT
SYR MEDRAD MARK 7 150ML (SYRINGE) ×2 IMPLANT
TRANSDUCER W/STOPCOCK (MISCELLANEOUS) ×2 IMPLANT
TUBING CIL FLEX 10 FLL-RA (TUBING) ×2 IMPLANT

## 2020-07-25 NOTE — Interval H&P Note (Signed)
History and Physical Interval Note:  07/25/2020 7:29 AM  Alexander [name]  has presented today for surgery, with the diagnosis of unstable angina.  The various methods of treatment have been discussed with the patient and family. After consideration of risks, benefits and other options for treatment, the patient has consented to  Procedure(s): RIGHT/LEFT HEART CATH AND CORONARY ANGIOGRAPHY (N/A) as a surgical intervention.  The patient's history has been reviewed, patient examined, no change in status, stable for surgery.  I have reviewed the patient's chart and labs.  Questions were answered to the patient's satisfaction.    Cath Lab Visit (complete for each Cath Lab visit)  Clinical Evaluation Leading to the Procedure:   ACS: No.  Non-ACS:    Anginal Classification: CCS III  Anti-ischemic medical therapy: Minimal Therapy (1 class of medications)  Non-Invasive Test Results: Low risk stress test  Prior CABG: No previous CABG    Alexander [name]

## 2020-07-25 NOTE — Progress Notes (Signed)
Ambulated to the bathroom to void tol well  

## 2020-07-25 NOTE — Progress Notes (Signed)
Arm splint applied to right arm.  

## 2020-07-25 NOTE — Discharge Instructions (Signed)
Hold metformin 48 hours post cath.    Radial Site Care  This sheet gives you information about how to care for yourself after your procedure. Your health care provider may also give you more specific instructions. If you have problems or questions, contact your health care provider. What can I expect after the procedure? After the procedure, it is common to have:  Bruising and tenderness at the catheter insertion area. Follow these instructions at home: Medicines  Take over-the-counter and prescription medicines only as told by your health care provider. Insertion site care  Follow instructions from your health care provider about how to take care of your insertion site. Make sure you: ? Wash your hands with soap and water before you change your bandage (dressing). If soap and water are not available, use hand sanitizer. ? Change your dressing as told by your health care provider. ? Leave stitches (sutures), skin glue, or adhesive strips in place. These skin closures may need to stay in place for 2 weeks or longer. If adhesive strip edges start to loosen and curl up, you may trim the loose edges. Do not remove adhesive strips completely unless your health care provider tells you to do that.  Check your insertion site every day for signs of infection. Check for: ? Redness, swelling, or pain. ? Fluid or blood. ? Pus or a bad smell. ? Warmth.  Do not take baths, swim, or use a hot tub until your health care provider approves.  You may shower 24-48 hours after the procedure, or as directed by your health care provider. ? Remove the dressing and gently wash the site with plain soap and water. ? Pat the area dry with a clean towel. ? Do not rub the site. That could cause bleeding.  Do not apply powder or lotion to the site. Activity  For 24 hours after the procedure, or as directed by your health care provider: ? Do not flex or bend the affected arm. ? Do not push or pull heavy  objects with the affected arm. ? Do not drive yourself home from the hospital or clinic. You may drive 24 hours after the procedure unless your health care provider tells you not to. ? Do not operate machinery or power tools.  Do not lift anything that is heavier than 10 lb (4.5 kg), or the limit that you are told, until your health care provider says that it is safe.  Ask your health care provider when it is okay to: ? Return to work or school. ? Resume usual physical activities or sports. ? Resume sexual activity.   General instructions  If the catheter site starts to bleed, raise your arm and put firm pressure on the site. If the bleeding does not stop, get help right away. This is a medical emergency.  If you went home on the same day as your procedure, a responsible adult should be with you for the first 24 hours after you arrive home.  Keep all follow-up visits as told by your health care provider. This is important. Contact a health care provider if:  You have a fever.  You have redness, swelling, or yellow drainage around your insertion site. Get help right away if:  You have unusual pain at the radial site.  The catheter insertion area swells very fast.  The insertion area is bleeding, and the bleeding does not stop when you hold steady pressure on the area.  Your arm or hand becomes pale, cool,  tingly, or numb. These symptoms may represent a serious problem that is an emergency. Do not wait to see if the symptoms will go away. Get medical help right away. Call your local emergency services (911 in the [place] Do not drive yourself to the hospital. Summary  After the procedure, it is common to have bruising and tenderness at the site.  Follow instructions from your health care provider about how to take care of your radial site wound. Check the wound every day for signs of infection.  Do not lift anything that is heavier than 10 lb (4.5 kg), or the limit that you are  told, until your health care provider says that it is safe. This information is not intended to replace advice given to you by your health care provider. Make sure you discuss any questions you have with your health care provider. Document Revised: 06/15/2017 Document Reviewed: 06/15/2017 Elsevier Patient Education  2021 [employer].

## 2020-08-08 ENCOUNTER — Other Ambulatory Visit: Payer: Self-pay

## 2020-08-08 ENCOUNTER — Ambulatory Visit (HOSPITAL_COMMUNITY): Payer: Medicare Other | Attending: Internal Medicine

## 2020-08-08 DIAGNOSIS — I200 Unstable angina: Secondary | ICD-10-CM | POA: Insufficient documentation

## 2020-08-08 DIAGNOSIS — Z01812 Encounter for preprocedural laboratory examination: Secondary | ICD-10-CM

## 2020-08-08 LAB — ECHOCARDIOGRAM COMPLETE
Area-P 1/2: 3.4 cm2
S' Lateral: 2.4 cm

## 2020-08-08 MED ORDER — PERFLUTREN LIPID MICROSPHERE: 1.0000 mL | INTRAVENOUS | Status: AC | PRN

## 2020-08-19 MED ORDER — PERFLUTREN LIPID MICROSPHERE
1.0000 mL | INTRAVENOUS | Status: AC | PRN
  Administered 2020-08-19: 2 mL via INTRAVENOUS

## 2020-08-25 ENCOUNTER — Encounter (INDEPENDENT_AMBULATORY_CARE_PROVIDER_SITE_OTHER): Payer: Self-pay

## 2020-08-25 ENCOUNTER — Ambulatory Visit (INDEPENDENT_AMBULATORY_CARE_PROVIDER_SITE_OTHER): Payer: Medicare Other | Admitting: Cardiovascular Disease

## 2020-08-25 ENCOUNTER — Other Ambulatory Visit: Payer: Self-pay

## 2020-08-25 ENCOUNTER — Encounter: Payer: Self-pay | Admitting: Cardiovascular Disease

## 2020-08-25 VITALS — BP 138/64 | HR 83 | Ht 70.0 in | Wt 253.0 lb

## 2020-08-25 DIAGNOSIS — I2510 Atherosclerotic heart disease of native coronary artery without angina pectoris: Secondary | ICD-10-CM

## 2020-08-25 DIAGNOSIS — I200 Unstable angina: Secondary | ICD-10-CM

## 2020-08-25 MED ORDER — ROSUVASTATIN CALCIUM 5 MG PO TABS: 5.0000 mg | ORAL_TABLET | ORAL | 3 refills | Status: DC

## 2020-08-25 NOTE — Progress Notes (Signed)
Chief Complaint  Patient presents with  . Follow-up    Chest pain   History of Present Illness: 70 yo Alexander [name] with history of diabetes mellitus, chest pain, gout, GERD, HTN, hyperlipidemia, sleep apnea on CPAP and prior stroke who is here today for cardiac follow up. I saw him as a new patient for evaluation of chest pain 07/21/20. He had a cardiac catheterization in 2006 with no evidence of CAD. Echo February 2014 with LVEF=55-60%, no valve disease. He described central chest pain for several weeks with associated dyspnea and left arm tingling. His energy level had been down over the past few weeks. He was seen in primary care and had a stress test arranged. Nuclear stress test at [facility] 07/18/20 with no evidence of ischemia. LVEF=65%. His former daughter-in-law works in our cath lab [employer]). Cardiac cath 07/25/20 with mild non-obstructive CAD (20% proximal RCA stenosis, 20% mid Circumflex stenosis). LV systolic function normal by LV gram. Normal LV filling pressures. Echo 08/08/20 with LVEF=60-65%, moderate basal septal hypertrophy. No significant valve disease. He was found to be anemic by pre-cath lab testing. D-dimer negative. Of note, pre-cath labs with Hgb 10.4 down from 15.3 4 years ago. No bleeding reported by pt.   He is here today for follow up. The patient denies any chest pain, dyspnea, palpitations, lower extremity edema, orthopnea, PND, dizziness, near syncope or syncope.   Primary Care Physician: Monico Blitz, MD   Past Medical History:  Diagnosis Date  . Chest pain    a. 10.2006 Cath: nl cors, EF 65%;  b. 12/2008 Negative myoview.  . DDD (degenerative disc disease), cervical    a. 08/2008 s/p R L4-5 diskectomy, foaminotomy, and lysis of adhesions.  . Diabetes ([place])   . GERD (gastroesophageal reflux disease)   . Gout   . History of kidney stones   . Hyperlipidemia   . Hypertension   . OSA (obstructive sleep apnea)    a. on cpap  NOT ABLE TO USE  . Palpitations     a. 11/2008 - nl holter monitor;  b. 04/2010 Echo: EF 50-55%, Gr 1 DD.  . Skin cancer   . Stroke [facility])    a. 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    Past Surgical History:  Procedure Laterality Date  . APPENDECTOMY    . BACK SURGERY    . CATARACT EXTRACTION     RIGHT  . FOOT SURGERY    . HEMORROIDECTOMY    . KNEE SURGERY    . LEFT HEART CATHETERIZATION WITH CORONARY ANGIOGRAM N/A 07/18/2012   Procedure: LEFT HEART CATHETERIZATION WITH CORONARY ANGIOGRAM;  Surgeon: Burnell Blanks, MD;  Location: [facility] CATH LAB;  Service: Cardiovascular;  Laterality: N/A;  . LUMBAR LAMINECTOMY/DECOMPRESSION MICRODISCECTOMY Left 01/27/2016   Procedure: Left Lumbar Four-Five, Lumbar FIve-Sacral One Laminectomy/Foraminotomy;  Surgeon: Leeroy Cha, MD;  Location: [facility] NEURO ORS;  Service: Neurosurgery;  Laterality: Left;  Left L4-5 L5-S1 Laminectomy/Foraminotomy  . RIGHT/LEFT HEART CATH AND CORONARY ANGIOGRAPHY N/A 07/25/2020   Procedure: RIGHT/LEFT HEART CATH AND CORONARY ANGIOGRAPHY;  Surgeon: Burnell Blanks, MD;  Location: [facility] CV LAB;  Service: Cardiovascular;  Laterality: N/A;  . TONSILLECTOMY      Current Outpatient Medications  Medication Sig Dispense Refill  . allopurinol (ZYLOPRIM) 300 MG tablet Take 300 mg by mouth at bedtime.    Marland Kitchen aspirin EC 81 MG tablet Take 81 mg by mouth every evening.    . Cholecalciferol (VITAMIN D3) 50 MCG (2000 UT)  TABS Take 2,000 Units by mouth daily.    . fexofenadine (ALLEGRA) 180 MG tablet Take 180 mg by mouth daily as needed (allergies).    . metFORMIN (GLUCOPHAGE) 500 MG tablet Take 500 mg by mouth in the morning and at bedtime.    . metoprolol succinate (TOPROL-XL) 50 MG 24 hr tablet Take 50 mg by mouth daily.    Marland Kitchen omeprazole (PRILOSEC) 20 MG capsule Take 20 mg by mouth every evening.    . rosuvastatin (CRESTOR) 5 MG tablet Take 1 tablet (5 mg total) by mouth 3 (three) times a week. 15 tablet 3  . Sunscreens (AVEENO ACT NAT PROTECT+HYDRATE)  LOTN Apply 1 application topically daily. Nose     No current facility-administered medications for this visit.    Allergies  Allergen Reactions  . Contrast Media [Iodinated Diagnostic Agents] Other (See Comments)    STROKE  . Gadolinium Other (See Comments)     Code: VOM, Desc: unusual reaction, patient was nauseous, lost consciousness, Onset Date: [id number]     Social History   Socioeconomic History  . Marital status: Married    Spouse name: Not on file  . Number of children: 1  . Years of education: Not on file  . Highest education level: Not on file  Occupational History  . 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 Smoking status: Former Smoker    Types: Cigarettes    Quit date: 05/25/1983    Years since quitting: 37.2  . Smokeless tobacco: Never Used  . Tobacco comment: quit smoking in 1986  Substance and Sexual Activity  . Alcohol use: No    Comment: former - quit driking in 1983.  . Drug use: No  . Sexual activity: Not on file  Other Topics Concern  . Not on file  Social History Narrative   Lives in [place], [place] with spouse.  Works as a Administrator.   Social Determinants of Health   Financial Resource Strain: Not on file  Food Insecurity: Not on file  Transportation Needs: Not on file  Physical Activity: Not on file  Stress: Not on file  Social Connections: Not on file  Intimate Partner Violence: Not on file    Family History  Problem Relation Age of Onset  . Cancer Mother   . Heart attack Father     Review of Systems:  As stated in the HPI and otherwise negative.   BP 138/64   Pulse 83   Ht 5\' 10"  (1.778 m)   Wt 253 lb (114.8 kg)   SpO2 95%   BMI 36.30 kg/m   Physical Examination: General: Well developed, well nourished, NAD  HEENT: OP clear, mucus membranes moist  SKIN: warm, dry. No rashes. Neuro: No focal deficits  Musculoskeletal: Muscle strength 5/5 all ext  Psychiatric: Mood and affect normal  Neck: No JVD, no carotid  bruits, no thyromegaly, no lymphadenopathy.  Lungs:Clear bilaterally, no wheezes, rhonci, crackles Cardiovascular: Regular rate and rhythm. No murmurs, gallops or rubs. Abdomen:Soft. Bowel sounds present. Non-tender.  Extremities: No lower extremity edema. Pulses are 2 + in the bilateral DP/PT.  EKG:  EKG is ordered today. The ekg ordered today demonstrates NSR, no ischemic changes  Echo 08/08/20: 1. No mural thrombus with Definity. Left ventricular ejection fraction,  by estimation, is 60 to 65%. The left ventricle has normal function. The  left ventricle has no regional wall motion abnormalities. There is  moderate asymmetric left ventricular  hypertrophy of the basal-septal  segment. Left ventricular diastolic  parameters were normal.  2. Right ventricular systolic function is normal. The right ventricular  size is normal.  3. The mitral valve is grossly normal. Trivial mitral valve  regurgitation.  4. The aortic valve is tricuspid. Aortic valve regurgitation is not  visualized. Mild aortic valve sclerosis is present, with no evidence of  aortic valve stenosis.   Cardiac cath 07/25/20:  Prox RCA lesion is 20% stenosed.  Mid Cx lesion is 20% stenosed.  The left ventricular systolic function is normal.  LV end diastolic pressure is normal.  The left ventricular ejection fraction is greater than 65% by visual estimate.  There is no mitral valve regurgitation.   1. Mild non-obstructive CAD 2. Normal right and left heart pressures 3. Normal LV systolic function   Recent Labs: 07/21/2020: BUN 14; Creatinine, Ser 0.98; Hemoglobin 10.4; Platelets 95; Potassium 3.8; Sodium 138   Lipid Panel    Component Value Date/Time   CHOL Alexander 07/18/2012 0440   TRIG 136 07/18/2012 0440   HDL 27 (L) 07/18/2012 0440   CHOLHDL 2.9 07/18/2012 0440   VLDL 27 07/18/2012 0440   LDLCALC 25 07/18/2012 0440     Wt Readings from Last 3 Encounters:  08/25/20 253 lb (114.8 kg)  07/25/20 253  lb (114.8 kg)  07/21/20 254 lb (115.2 kg)      Assessment and Plan:   1. Chest pain: His chest pain is not felt to be cardiac related. Mild CAD by cath in March 2022. Normal filling pressures. LV function is normal.   2. CAD without angina: Mild CAD by cath march 2022. Will continue ASA. Will start Crestor 5 mg po three times per week. His LDL is low at baseline  3. Anemia: He will follow up with Dr. Manuella Ghazi for further workup. This was noted on pre-cath labs. Unclear etiology. He will need all age appropriate cancer screening.     Current medicines are reviewed at length with the patient today.  The patient does not have concerns regarding medicines.  The following changes have been made:  no change  Labs/ tests ordered today include:   No orders of the defined types were placed in this encounter.    Disposition:   F/U with me in one year.     Signed, Lauree Chandler, MD 08/25/2020 3:57 PM    [facility] [place], [place], [place]  [address] Phone: [phone]; Fax: [phone]

## 2020-08-25 NOTE — Patient Instructions (Signed)
Medication Instructions:  Your physician has recommended you make the following change in your medication:  1.) start rosuvastatin (Crestor) 5 mg - take one tablet 3 days per week  *If you need a refill on your cardiac medications before your next appointment, please call your pharmacy*   Lab Work: none   Testing/Procedures: none   Follow-Up: At [facility], you and your health needs are our priority.  As part of our continuing mission to provide you with exceptional heart care, we have created designated Provider Care Teams.  These Care Teams include your primary Cardiologist (physician) and Advanced Practice Providers (APPs -  Physician Assistants and Nurse Practitioners) who all work together to provide you with the care you need, when you need it.  We recommend signing up for the patient portal called "MyChart".  Sign up information is provided on this After Visit Summary.  MyChart is used to connect with patients for Virtual Visits (Telemedicine).  Patients are able to view lab/test results, encounter notes, upcoming appointments, etc.  Non-urgent messages can be sent to your provider as well.   To learn more about what you can do with MyChart, go to [email or web address].    Your next appointment:   12 month(s)  The format for your next appointment:   In Person  Provider:   You may see Lauree Chandler, MD or one of the following Advanced Practice Providers on your designated Care Team:    Melina Copa, PA-C  Ermalinda Barrios, PA-C

## 2021-07-08 ENCOUNTER — Inpatient Hospital Stay (HOSPITAL_COMMUNITY)
Admission: EM | Admit: 2021-07-08 | Discharge: 2021-07-11 | DRG: 812 | Disposition: A | Discharge status: Home / Self Care | Payer: Medicare Other | Attending: Internal Medicine | Admitting: Internal Medicine

## 2021-07-08 ENCOUNTER — Telehealth: Payer: Self-pay | Admitting: Cardiovascular Disease

## 2021-07-08 ENCOUNTER — Emergency Department (HOSPITAL_COMMUNITY): Payer: Medicare Other

## 2021-07-08 ENCOUNTER — Other Ambulatory Visit: Payer: Self-pay

## 2021-07-08 DIAGNOSIS — D126 Benign neoplasm of colon, unspecified: Secondary | ICD-10-CM

## 2021-07-08 DIAGNOSIS — K635 Polyp of colon: Secondary | ICD-10-CM | POA: Diagnosis present

## 2021-07-08 DIAGNOSIS — K31819 Angiodysplasia of stomach and duodenum without bleeding: Secondary | ICD-10-CM | POA: Diagnosis present

## 2021-07-08 DIAGNOSIS — D696 Thrombocytopenia, unspecified: Secondary | ICD-10-CM | POA: Diagnosis not present

## 2021-07-08 DIAGNOSIS — D509 Iron deficiency anemia, unspecified: Secondary | ICD-10-CM | POA: Diagnosis not present

## 2021-07-08 DIAGNOSIS — Z8249 Family history of ischemic heart disease and other diseases of the circulatory system: Secondary | ICD-10-CM

## 2021-07-08 DIAGNOSIS — G4733 Obstructive sleep apnea (adult) (pediatric): Secondary | ICD-10-CM | POA: Diagnosis present

## 2021-07-08 DIAGNOSIS — Z7982 Long term (current) use of aspirin: Secondary | ICD-10-CM

## 2021-07-08 DIAGNOSIS — M545 Low back pain, unspecified: Secondary | ICD-10-CM | POA: Diagnosis present

## 2021-07-08 DIAGNOSIS — I2510 Atherosclerotic heart disease of native coronary artery without angina pectoris: Secondary | ICD-10-CM | POA: Diagnosis present

## 2021-07-08 DIAGNOSIS — K5520 Angiodysplasia of colon without hemorrhage: Secondary | ICD-10-CM

## 2021-07-08 DIAGNOSIS — Z87891 Personal history of nicotine dependence: Secondary | ICD-10-CM

## 2021-07-08 DIAGNOSIS — E119 Type 2 diabetes mellitus without complications: Secondary | ICD-10-CM | POA: Diagnosis present

## 2021-07-08 DIAGNOSIS — I10 Essential (primary) hypertension: Secondary | ICD-10-CM | POA: Diagnosis present

## 2021-07-08 DIAGNOSIS — Z79899 Other long term (current) drug therapy: Secondary | ICD-10-CM

## 2021-07-08 DIAGNOSIS — G8929 Other chronic pain: Secondary | ICD-10-CM | POA: Diagnosis present

## 2021-07-08 DIAGNOSIS — Z91041 Radiographic dye allergy status: Secondary | ICD-10-CM

## 2021-07-08 DIAGNOSIS — K621 Rectal polyp: Secondary | ICD-10-CM | POA: Diagnosis present

## 2021-07-08 DIAGNOSIS — Z7984 Long term (current) use of oral hypoglycemic drugs: Secondary | ICD-10-CM

## 2021-07-08 DIAGNOSIS — Z20822 Contact with and (suspected) exposure to covid-19: Secondary | ICD-10-CM | POA: Diagnosis present

## 2021-07-08 DIAGNOSIS — D649 Anemia, unspecified: Principal | ICD-10-CM

## 2021-07-08 DIAGNOSIS — Z8673 Personal history of transient ischemic attack (TIA), and cerebral infarction without residual deficits: Secondary | ICD-10-CM

## 2021-07-08 DIAGNOSIS — Z85828 Personal history of other malignant neoplasm of skin: Secondary | ICD-10-CM

## 2021-07-08 DIAGNOSIS — E785 Hyperlipidemia, unspecified: Secondary | ICD-10-CM | POA: Diagnosis present

## 2021-07-08 DIAGNOSIS — M109 Gout, unspecified: Secondary | ICD-10-CM | POA: Diagnosis present

## 2021-07-08 DIAGNOSIS — K219 Gastro-esophageal reflux disease without esophagitis: Secondary | ICD-10-CM | POA: Diagnosis present

## 2021-07-08 LAB — BASIC METABOLIC PANEL
Anion gap: 13 (ref 5–15)
BUN: 12 mg/dL (ref 8–23)
CO2: 21 mmol/L — ABNORMAL LOW (ref 22–32)
Calcium: 9.3 mg/dL (ref 8.9–10.3)
Chloride: 104 mmol/L (ref 98–111)
Creatinine, Ser: 1.02 mg/dL (ref 0.61–1.24)
GFR, Estimated: >60 mL/min (ref >60)
Glucose, Bld: 114 mg/dL — ABNORMAL HIGH (ref 70–99)
Potassium: 3.6 mmol/L (ref 3.5–5.1)
Sodium: 138 mmol/L (ref 135–145)

## 2021-07-08 LAB — CBC WITH DIFFERENTIAL/PLATELET
Abs Immature Granulocytes: 0.04 10*3/uL (ref 0.00–0.07)
Basophils Absolute: 0.1 10*3/uL (ref 0.0–0.1)
Basophils Relative: 1 %
Eosinophils Absolute: 0.1 10*3/uL (ref 0.0–0.5)
Eosinophils Relative: 1 %
HCT: 28.2 % — ABNORMAL LOW (ref 39.0–52.0)
Hemoglobin: 8 g/dL — ABNORMAL LOW (ref 13.0–17.0)
Immature Granulocytes: 1 %
Lymphocytes Relative: 18 %
Lymphs Abs: 1.3 10*3/uL (ref 0.7–4.0)
MCH: 21.6 pg — ABNORMAL LOW (ref 26.0–34.0)
MCHC: 28.4 g/dL — ABNORMAL LOW (ref 30.0–36.0)
MCV: 76 fL — ABNORMAL LOW (ref 80.0–100.0)
Monocytes Absolute: 0.6 10*3/uL (ref 0.1–1.0)
Monocytes Relative: 9 %
Neutro Abs: 5.3 10*3/uL (ref 1.7–7.7)
Neutrophils Relative %: 70 %
Platelets: 113 10*3/uL — ABNORMAL LOW (ref 150–400)
RBC: 3.71 MIL/uL — ABNORMAL LOW (ref 4.22–5.81)
RDW: 19 % — ABNORMAL HIGH (ref 11.5–15.5)
WBC: 7.4 10*3/uL (ref 4.0–10.5)
nRBC: 0 % (ref 0.0–0.2)

## 2021-07-08 LAB — BRAIN NATRIURETIC PEPTIDE: B Natriuretic Peptide: 111.7 pg/mL — ABNORMAL HIGH (ref 0.0–100.0)

## 2021-07-08 LAB — MAGNESIUM: Magnesium: 1.9 mg/dL (ref 1.7–2.4)

## 2021-07-08 LAB — TROPONIN I (HIGH SENSITIVITY)
Troponin I (High Sensitivity): 9 ng/L (ref <18)
Troponin I (High Sensitivity): 8 ng/L (ref <18)

## 2021-07-08 NOTE — ED Triage Notes (Signed)
Arrived POV; from home. Reported he was told he's blood pressure is high by the dentist; patient c/o orthopnea and shortness of breath on exertion.

## 2021-07-08 NOTE — Telephone Encounter (Signed)
Returning call to patient after receiving second call from wife. Explained that son had instructed me that pt would call us back and we have not gotten a return call. Wife states that while at the dentist, the pt's BP was elevated and they wouldn't treat him. Reading they got was 190/90. Pt states since Saturday 2/11 he "cant get no air." Wife made him wear his CPAP Saturday because he was so bad. Pt went to see his PCP Monday at [facility] and they told him they suspect pneumonia. They did a CXR but told him results wouldn't be back for a few days and prescribed him Albuterol inhaler and levaquin. Pt picked up meds yesterday (2/14) and began taking. Pt states that his heart feels like its been beating fast in association with the breathlessness, but denies checking his HR or o2 at home. Also does not check his BP at home and didn't know his pressure was high prior to seeing dentist. Pt denies cough or swelling recently. Pt denies that albuterol helped him and notes that racing heart was happening consistently prior to using inhaler. Pt is currently at [facility] room. Talking easily with no audible breathing difficulties, but he does condone still feeling SOB. States they didn't want to go "all the way home" and decided to go to ED to figure out what was wrong. Will send to primary cardiologist as an [place].

## 2021-07-08 NOTE — Telephone Encounter (Signed)
Patient's wife is calling to check on status of call from this morning.

## 2021-07-08 NOTE — Telephone Encounter (Signed)
Attempted to call patient back. First number on file was work number and was told to try his cell phone. Called Cell phone and son picked up stating that pt was currently in dentist office and he would have him call back.

## 2021-07-08 NOTE — Telephone Encounter (Signed)
Pt c/o Shortness Of Breath: STAT if SOB developed within the last 24 hours or pt is noticeably SOB on the phone  1. Are you currently SOB (can you hear that pt is SOB on the phone)?  No, per patient's wife   2. How long have you been experiencing SOB?  About 3-4 weeks  3. Are you SOB when sitting or when up moving around?  Both   4. Are you currently experiencing any other symptoms?  Elevated HR   STAT if HR is under 50 or over 120 (normal HR is 60-100 beats per minute)  What is your heart rate?  No readings available, but HR has been elevated  Do you have a log of your heart rate readings (document readings)?  No readings available   Do you have any other symptoms?  No

## 2021-07-08 NOTE — ED Provider Triage Note (Signed)
Emergency Medicine Provider Triage Evaluation Note  Alexander [name] , a 71 y.o. male  was evaluated in triage.  Pt complains of shortness of breath, chest tightness, PND, orthopnea.  He is having increasing palpitations.  States he is unable to do little activities without getting chest tightness, dyspnea on exertion.  Was previously seen by cardiology many years ago however none recently.  No lower extremity swelling however does admit to some PND, orthopnea, uses CPAP at night.  Sleeps with 3 pillows.  Review of Systems  Positive: CP, SOB, palpitations, PND, orthopnea Negative: Fever, emesis  Physical Exam  There were no vitals taken for this visit. Gen:   Awake, no distress   Resp:  Normal effort  MSK:   Moves extremities without difficulty  Other:    Medical Decision Making  Medically screening exam initiated at 4:40 PM.  Appropriate orders placed.  [name] was informed that the remainder of the evaluation will be completed by another provider, this initial triage assessment does not replace that evaluation, and the importance of remaining in the ED until their evaluation is complete.  Dyspnea on exertion, chest tightness   Abeer Deskins A, PA-C 07/08/21 1641

## 2021-07-09 DIAGNOSIS — D509 Iron deficiency anemia, unspecified: Secondary | ICD-10-CM | POA: Diagnosis not present

## 2021-07-09 DIAGNOSIS — D649 Anemia, unspecified: Secondary | ICD-10-CM | POA: Diagnosis present

## 2021-07-09 DIAGNOSIS — D696 Thrombocytopenia, unspecified: Secondary | ICD-10-CM | POA: Diagnosis not present

## 2021-07-09 LAB — CBC WITH DIFFERENTIAL/PLATELET
Abs Immature Granulocytes: 0.03 10*3/uL (ref 0.00–0.07)
Basophils Absolute: 0 10*3/uL (ref 0.0–0.1)
Basophils Relative: 1 %
Eosinophils Absolute: 0.1 10*3/uL (ref 0.0–0.5)
Eosinophils Relative: 3 %
HCT: 25.8 % — ABNORMAL LOW (ref 39.0–52.0)
Hemoglobin: 7.5 g/dL — ABNORMAL LOW (ref 13.0–17.0)
Immature Granulocytes: 1 %
Lymphocytes Relative: 18 %
Lymphs Abs: 0.9 10*3/uL (ref 0.7–4.0)
MCH: 21.9 pg — ABNORMAL LOW (ref 26.0–34.0)
MCHC: 29.1 g/dL — ABNORMAL LOW (ref 30.0–36.0)
MCV: 75.4 fL — ABNORMAL LOW (ref 80.0–100.0)
Monocytes Absolute: 0.5 10*3/uL (ref 0.1–1.0)
Monocytes Relative: 10 %
Neutro Abs: 3.2 10*3/uL (ref 1.7–7.7)
Neutrophils Relative %: 67 %
Platelets: 100 10*3/uL — ABNORMAL LOW (ref 150–400)
RBC: 3.42 MIL/uL — ABNORMAL LOW (ref 4.22–5.81)
RDW: 18.7 % — ABNORMAL HIGH (ref 11.5–15.5)
WBC: 4.8 10*3/uL (ref 4.0–10.5)
nRBC: 0 % (ref 0.0–0.2)

## 2021-07-09 LAB — COMPREHENSIVE METABOLIC PANEL
ALT: 19 U/L (ref 0–44)
AST: 27 U/L (ref 15–41)
Albumin: 3.3 g/dL — ABNORMAL LOW (ref 3.5–5.0)
Alkaline Phosphatase: 57 U/L (ref 38–126)
Anion gap: 8 (ref 5–15)
BUN: 12 mg/dL (ref 8–23)
CO2: 24 mmol/L (ref 22–32)
Calcium: 8.9 mg/dL (ref 8.9–10.3)
Chloride: 106 mmol/L (ref 98–111)
Creatinine, Ser: 0.92 mg/dL (ref 0.61–1.24)
GFR, Estimated: >60 mL/min (ref >60)
Glucose, Bld: 134 mg/dL — ABNORMAL HIGH (ref 70–99)
Potassium: 3.8 mmol/L (ref 3.5–5.1)
Sodium: 138 mmol/L (ref 135–145)
Total Bilirubin: 0.7 mg/dL (ref 0.3–1.2)
Total Protein: 6.7 g/dL (ref 6.5–8.1)

## 2021-07-09 LAB — PROTIME-INR
INR: 1.2 (ref 0.8–1.2)
Prothrombin Time: 15.2 seconds (ref 11.4–15.2)

## 2021-07-09 LAB — FOLATE: Folate: 19.6 ng/mL (ref >5.9)

## 2021-07-09 LAB — RETICULOCYTES
Immature Retic Fract: 37.1 % — ABNORMAL HIGH (ref 2.3–15.9)
RBC.: 3.42 MIL/uL — ABNORMAL LOW (ref 4.22–5.81)
Retic Count, Absolute: 72.2 10*3/uL (ref 19.0–186.0)
Retic Ct Pct: 2.1 % (ref 0.4–3.1)

## 2021-07-09 LAB — RESP PANEL BY RT-PCR (FLU A&B, COVID) ARPGX2
Influenza A by PCR: NEGATIVE
Influenza B by PCR: NEGATIVE
SARS Coronavirus 2 by RT PCR: NEGATIVE

## 2021-07-09 LAB — FERRITIN: Ferritin: 4 ng/mL — ABNORMAL LOW (ref 24–336)

## 2021-07-09 LAB — IRON AND TIBC
Iron: 16 ug/dL — ABNORMAL LOW (ref 45–182)
Saturation Ratios: 4 % — ABNORMAL LOW (ref 17.9–39.5)
TIBC: 445 ug/dL (ref 250–450)
UIBC: 429 ug/dL

## 2021-07-09 LAB — HEMOGLOBIN A1C
Hgb A1c MFr Bld: 7.2 % — ABNORMAL HIGH (ref 4.8–5.6)
Mean Plasma Glucose: 159.94 mg/dL

## 2021-07-09 LAB — VITAMIN B12: Vitamin B-12: 284 pg/mL (ref 180–914)

## 2021-07-09 MED ORDER — METOPROLOL SUCCINATE ER 50 MG PO TB24
50.0000 mg | ORAL_TABLET | Freq: Every day | ORAL | Status: DC
  Administered 2021-07-09 – 2021-07-10 (×2): 50 mg via ORAL
  Filled 2021-07-09 (×2): qty 1

## 2021-07-09 MED ORDER — ROSUVASTATIN CALCIUM 5 MG PO TABS
5.0000 mg | ORAL_TABLET | Freq: Every day | ORAL | Status: DC
  Administered 2021-07-09 – 2021-07-10 (×2): 5 mg via ORAL
  Filled 2021-07-09 (×2): qty 1

## 2021-07-09 MED ORDER — METOCLOPRAMIDE HCL 5 MG/ML IJ SOLN
10.0000 mg | Freq: Four times a day (QID) | INTRAMUSCULAR | Status: AC
  Administered 2021-07-09 (×2): 10 mg via INTRAVENOUS
  Filled 2021-07-09 (×2): qty 2

## 2021-07-09 MED ORDER — ALLOPURINOL 300 MG PO TABS
300.0000 mg | ORAL_TABLET | Freq: Every day | ORAL | Status: DC
  Administered 2021-07-09 – 2021-07-10 (×2): 300 mg via ORAL
  Filled 2021-07-09 (×2): qty 1

## 2021-07-09 MED ORDER — PEG-KCL-NACL-NASULF-NA ASC-C 100 G PO SOLR
0.5000 | Freq: Once | ORAL | Status: AC
  Administered 2021-07-09: 100 g via ORAL
  Filled 2021-07-09: qty 1

## 2021-07-09 MED ORDER — ACETAMINOPHEN 500 MG PO TABS: 1000.0000 mg | ORAL_TABLET | Freq: Four times a day (QID) | ORAL | Status: DC | PRN

## 2021-07-09 MED ORDER — BISACODYL 5 MG PO TBEC
20.0000 mg | DELAYED_RELEASE_TABLET | Freq: Once | ORAL | Status: AC
  Administered 2021-07-09: 20 mg via ORAL
  Filled 2021-07-09: qty 4

## 2021-07-09 MED ORDER — BISACODYL 5 MG PO TBEC: 10.0000 mg | DELAYED_RELEASE_TABLET | Freq: Once | ORAL | Status: DC

## 2021-07-09 MED ORDER — SODIUM CHLORIDE 0.9 % IV SOLN
510.0000 mg | Freq: Once | INTRAVENOUS | Status: AC
  Administered 2021-07-10: 510 mg via INTRAVENOUS
  Filled 2021-07-09 (×2): qty 17

## 2021-07-09 MED ORDER — PANTOPRAZOLE SODIUM 40 MG IV SOLR
40.0000 mg | Freq: Two times a day (BID) | INTRAVENOUS | Status: DC
  Administered 2021-07-09 – 2021-07-10 (×4): 40 mg via INTRAVENOUS
  Filled 2021-07-09 (×4): qty 10

## 2021-07-09 MED ORDER — PEG-KCL-NACL-NASULF-NA ASC-C 100 G PO SOLR: 1.0000 | Freq: Once | ORAL | Status: DC

## 2021-07-09 MED ORDER — GABAPENTIN 100 MG PO CAPS: 100.0000 mg | ORAL_CAPSULE | Freq: Every day | ORAL | Status: DC | PRN

## 2021-07-09 MED ORDER — ALBUTEROL SULFATE (2.5 MG/3ML) 0.083% IN NEBU: 2.5000 mg | INHALATION_SOLUTION | Freq: Four times a day (QID) | RESPIRATORY_TRACT | Status: DC | PRN

## 2021-07-09 MED ORDER — PANTOPRAZOLE 80MG IVPB - SIMPLE MED
80.0000 mg | Freq: Once | INTRAVENOUS | Status: AC
  Administered 2021-07-09: 80 mg via INTRAVENOUS
  Filled 2021-07-09: qty 80

## 2021-07-09 NOTE — H&P (View-Only) (Signed)
Attending physician's note   I have taken a history, reviewed the chart, and examined the patient. I performed a substantive portion of this encounter, including complete performance of at least one of the key components, in conjunction with the APP. I agree with the APP's note, impression, and recommendations with my edits.   71 year old male with medical history as outlined below presenting with SOB, DOE, fatigue, orthopnea with elevated BP noted at his dentist office.  Was recently treated for suspected pneumonia on 2/13 with albuterol inhaler and Levaquin.  Has been taking Aleve for left back/flank pain.  Denies melena, hematochezia.  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  Admission evaluation notable for the following: - H/H 8/20.2 with MCV/RDW 76/19 - BUN/creatinine 12/1 - BNP 111 - Troponin negative x2 - Ferritin 4, iron 16, TIBC 445, sat 4% - B12 284, folate 19 - Chest x-ray normal   On review of prior labs, he has been having a downtrending H/H and declining MCV as follows: - 2017: H/H 15/46 with MCV/RDW 93/15 - 02/2020: H/H11.5/35.4 with MCV/RDW 79/16 - 06/2020: H/H 10.4/32.6 with MCV/RDW 76/19  1) Symptomatic iron deficiency anemia 2) Nausea/Vomiting - Suspect he has been having slow, occult bleed for some time now, as he has been having declining H/H since at least 2021, with decreasing MCV and expanding RDW. - RBC transfusion for Hgb <7 - Holding aspirin; stopped other NSAIDs - Clears okay today with n.p.o. at midnight - Started on high-dose PPI - Plan for EGD/colonoscopy tomorrow for diagnostic and therapeutic intent - Will benefit from dose of IV iron on this admission  3) CAD - Holding aspirin  4) Chronic thrombocytopenia - Stable PLT at 113 on arrival  5) GERD - Well-controlled on outpatient  omeprazole  The indications, risks, and benefits of EGD and colonoscopy were explained to the patient in detail. Risks include but are not limited to bleeding, perforation, adverse reaction to medications, and cardiopulmonary compromise. Sequelae include but are not limited to the possibility of surgery, hospitalization, and mortality. The patient verbalized understanding and wished to proceed. All questions answered. Further recommendations pending results of the exam.    Cephas Darby, FACG [phone] office                                                                                   [facility] Consult: 10:31 AM 07/09/2021  LOS: 0 days    Referring Provider: Dr Kathrynn Humble  Primary Care Physician:  Monico Blitz, MD Primary Gastroenterologist:  unassigned    Reason for Consultation: Anemia.     HPI: Alexander [name] is a 71 y.o. male.  PMH OSA, intolerant of CPAP.  Small stroke 2011.  LVEF 60 to 65% per echo in 07/2020.  Mild, nonobstructive CAD, normal LV function on cardiac cath 07/2020.  NIDDM. Microcytic anemia.  GERD.  Gout.  Surgeries include hemorrhoidectomy, orthopedic surgeries. Colonoscopy and EGD in 12 or 15 years ago, patient recalls no specific pathology.  Reports that he used to suffer a lot from heartburn but low-dose Prilosec has resolved that issue.  Dentist noted  blood pressure reading 190/90, arms subsequently canceled planned dental cleaning.  Experiencing shortness of breath, fatigue, insomnia, tachycardia with activities and at rest for several days, has progressed recently.  Some gurgling sensation but not pain in the upper abdomen.  No angina.  Seen at PCP 2/13 with suspicion of pneumonia, chest x-ray results not available but received albuterol inhaler and Levaquin prescription which he started on 2/14.  Takes 1-2 Aleve per day prn  For at least 1 week, taking Aleve on a daily basis for left back/flank pain.  A month ago he had 3 or 4 days of  nausea, vomiting with no CGE or blood.  Does not see black, tarry, or bloody stools.  Mostly has daily bowel movements.  Appetite is good.  Weight stable though he lost and then gained back a few pounds w the N/V a month ago.  Hgb 8, was 10.4 a year ago.  MCV 76, same as a year ago..  Platelets 113, 95 a year ago. Low iron at 16, low iron sats, low ferritin at 4, normal TIBC.  B12 and folate within normal limits. High-sensitivity troponin normal: 9... 8.  MP slightly elevated at 111. BUNs/creatinine/GFR normal.  C-Met and lipid LFTs are ordered but not yet collected. CXR unremarkable.  Has not smoked cigarettes or drank alcohol since mid 1980s.  Not a heavy drinker prior to that.  Family history negative for colorectal cancer, ulcers, anemia, IBD. 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  Past Medical History:  Diagnosis Date   Chest pain    a. 10.2006 Cath: nl cors, EF 65%;  b. 12/2008 Negative myoview.   DDD (degenerative disc disease), cervical    a. 08/2008 s/p R L4-5 diskectomy, foaminotomy, and lysis of adhesions.   Diabetes ([place])    GERD (gastroesophageal reflux disease)    Gout    History of kidney stones    Hyperlipidemia    Hypertension    OSA (obstructive sleep apnea)    a. on cpap  NOT ABLE TO USE   Palpitations    a. 11/2008 - nl holter monitor;  b. 04/2010 Echo: EF 50-55%, Gr 1 DD.   Skin cancer    Stroke [facility])    a. 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    Past Surgical History:  Procedure Laterality Date   APPENDECTOMY     BACK SURGERY     CATARACT EXTRACTION     RIGHT   FOOT SURGERY     HEMORROIDECTOMY     KNEE SURGERY     LEFT HEART CATHETERIZATION WITH CORONARY ANGIOGRAM N/A 07/18/2012   Procedure: LEFT HEART CATHETERIZATION WITH CORONARY ANGIOGRAM;  Surgeon: Burnell Blanks, MD;  Location: [facility] CATH LAB;  Service: Cardiovascular;  Laterality: N/A;   LUMBAR LAMINECTOMY/DECOMPRESSION MICRODISCECTOMY Left 01/27/2016   Procedure: Left Lumbar Four-Five, Lumbar  FIve-Sacral One Laminectomy/Foraminotomy;  Surgeon: Leeroy Cha, MD;  Location: [facility] NEURO ORS;  Service: Neurosurgery;  Laterality: Left;  Left L4-5 L5-S1 Laminectomy/Foraminotomy   RIGHT/LEFT HEART CATH AND CORONARY ANGIOGRAPHY N/A 07/25/2020   Procedure: RIGHT/LEFT HEART CATH AND CORONARY ANGIOGRAPHY;  Surgeon: Burnell Blanks, MD;  Location: [facility] CV LAB;  Service: Cardiovascular;  Laterality: N/A;   TONSILLECTOMY      Prior to Admission medications   Medication Sig Start Date End Date Taking? Authorizing Provider  acetaminophen (TYLENOL) 500 MG tablet Take 1,000 mg by mouth every 6 (six) hours as needed for mild pain.   Yes [provider]  albuterol (VENTOLIN HFA) 108 (90  Base) MCG/ACT inhaler Inhale 2 puffs into the lungs every 6 (six) hours as needed for wheezing or shortness of breath. 07/07/21  Yes [provider]  allopurinol (ZYLOPRIM) 300 MG tablet Take 300 mg by mouth at bedtime.   Yes [provider]  aspirin EC 81 MG tablet Take 81 mg by mouth every evening.   Yes [provider]  fexofenadine (ALLEGRA) 180 MG tablet Take 180 mg by mouth daily as needed (allergies).   Yes [provider]  gabapentin (NEURONTIN) 100 MG capsule Take 100 mg by mouth daily as needed (nerve pain).   Yes [provider]  ibuprofen (ADVIL) 200 MG tablet Take 400 mg by mouth every 6 (six) hours as needed for headache or mild pain.   Yes [provider]  levofloxacin (LEVAQUIN) 500 MG tablet Take 500 mg by mouth daily. For 10 days 07/07/21  Yes [provider]  metFORMIN (GLUCOPHAGE-XR) 500 MG 24 hr tablet Take 500 mg by mouth 2 (two) times daily. 05/27/21  Yes [provider]  metoprolol succinate (TOPROL-XL) 50 MG 24 hr tablet Take 50 mg by mouth at bedtime. 02/10/19  Yes [provider]  omeprazole (PRILOSEC) 20 MG capsule Take 20 mg by mouth every evening.   Yes [provider]  rosuvastatin  (CRESTOR) 5 MG tablet Take 1 tablet (5 mg total) by mouth 3 (three) times a week. Patient taking differently: Take 5 mg by mouth at bedtime. 08/25/20  Yes Burnell Blanks, MD    Scheduled Meds:  Infusions:  PRN Meds:    Allergies as of 07/08/2021 - Review Complete 07/08/2021  Allergen Reaction Noted   Contrast media [iodinated contrast media] Other (See Comments) 01/14/2016   Gadolinium Other (See Comments) 05/15/2010    Family History  Problem Relation Age of Onset   Cancer Mother    Heart attack Father     Social History   Socioeconomic History   Marital status: Married    Spouse name: Not on file   Number of children: 1   Years of education: Not on file   Highest education level: Not on file  Occupational History   Occupation: He owns a [employer]  Tobacco Use   Smoking status: Former    Types: Cigarettes    Quit date: 05/25/1983    Years since quitting: 38.1   Smokeless tobacco: Never   Tobacco comments:    quit smoking in 1986  Substance and Sexual Activity   Alcohol use: No    Comment: former - quit driking in 1983.   Drug use: No   Sexual activity: Not on file  Other Topics Concern   Not on file  Social History Narrative   Lives in [place], [place] with spouse.  Works as a Administrator.   Social Determinants of Health   Financial Resource Strain: Not on file  Food Insecurity: Not on file  Transportation Needs: Not on file  Physical Activity: Not on file  Stress: Not on file  Social Connections: Not on file  Intimate Partner Violence: Not on file    REVIEW OF SYSTEMS: Constitutional: Fatigue ENT:  No nose bleeds Pulm: Shortness of breath without cough CV: Positive tacky palpitations.  No angina. GU:  + frequency and nocturia.  No obvious blood in the urine. GI: No dysphagia.  See HPI. Heme: Denies unusual or excessive bleeding or bruising. Transfusions: Not ever. Neuro: Dull sense of head discomfort that he says is not even severe  enough  to call a headache. Derm:  No itching, no rash or sores.  Endocrine:  No sweats or chills.  No polyuria or dysuria Immunization: Not queried. Travel: Not queried.   PHYSICAL EXAM: Vital signs in last 24 hours: Vitals:   07/09/21 1000 07/09/21 1015  BP: (!) 149/82 (!) 166/79  Pulse: 70 80  Resp: 15 19  Temp:    SpO2: 97% 96%   Wt Readings from Last 3 Encounters:  07/08/21 114.8 kg  08/25/20 114.8 kg  07/25/20 114.8 kg    General: Obese, comfortable, not ill-appearing.  Alert Head: No facial asymmetry or swelling.  No signs of head trauma. Eyes: No conjunctival pallor.  No scleral icterus. Ears: Hearing aid in place Nose: No congestion or discharge Mouth: Oral mucosa pink, moist, clear.  Tongue midline. Neck: No JVD, no thyromegaly Lungs: Clear bilaterally without labored breathing. Heart: RRR.  No MRG.  S1, S2 present Abdomen: Soft, slight distention.  Active bowel sounds.  Minor discomfort in epigastric region without guarding or rebound.  No HSM, masses, bruits, hernias.   Rectal: No visible or palpable masses.  Vault empty, no stool or blood. Musc/Skeltl: No joint redness, swelling or significant deformity. Extremities: No lower extremity edema Neurologic: Alert.  Detailed historian.  Oriented x3.  Moves all 4 limbs without tremor, strength not tested Skin: No telangiectasia, rash, sores or suspicious lesions Nodes: No cervical adenopathy Psych: Garrulous, pleasant affect.  Fluid speech  Intake/Output from previous day: No intake/output data recorded. Intake/Output this shift: No intake/output data recorded.  LAB RESULTS: Recent Labs    07/08/21 1655  WBC 7.4  HGB 8.0*  HCT 28.2*  PLT 113*   BMET Lab Results  Component Value Date   NA 138 07/08/2021   NA 138 07/21/2020   NA 137 01/20/2016   K 3.6 07/08/2021   K 3.8 07/21/2020   K 4.1 01/20/2016   CL 104 07/08/2021   CL 102 07/21/2020   CL 99 (L) 01/20/2016   CO2 21 (L) 07/08/2021   CO2 27  07/21/2020   CO2 30 01/20/2016   GLUCOSE 114 (H) 07/08/2021   GLUCOSE 153 (H) 07/21/2020   GLUCOSE 109 (H) 01/20/2016   BUN 12 07/08/2021   BUN 14 07/21/2020   BUN 10 01/20/2016   CREATININE 1.02 07/08/2021   CREATININE 0.98 07/21/2020   CREATININE 0.97 01/20/2016   CALCIUM 9.3 07/08/2021   CALCIUM 9.9 07/21/2020   CALCIUM 9.8 01/20/2016   LFT No results for input(s): PROT, ALBUMIN, AST, ALT, ALKPHOS, BILITOT, BILIDIR, IBILI in the last 72 hours. PT/INR Lab Results  Component Value Date   INR 1.08 07/17/2012   INR 1.18 05/15/2010   INR 1.15 05/15/2010   Hepatitis Panel No results for input(s): HEPBSAG, HCVAB, HEPAIGM, HEPBIGM in the last 72 hours. C-Diff No components found for: CDIFF Lipase  No results found for: LIPASE  Drugs of Abuse  No results found for: LABOPIA, COCAINSCRNUR, LABBENZ, AMPHETMU, THCU, LABBARB   RADIOLOGY STUDIES: DG Chest 2 View  Result Date: 07/08/2021 CLINICAL DATA:  Chest pain, dyspnea on exertion EXAM: CHEST - 2 VIEW COMPARISON:  07/07/2021 FINDINGS: Heart size upper normal. Vascularity normal. Negative for heart failure or edema. Lungs are clear without infiltrate or effusion. IMPRESSION: No active cardiopulmonary disease. Electronically Signed   By: Franchot Gallo M.D.   On: 07/08/2021 18:30     IMPRESSION:   Symptomatic anemia.  Microcytic.  Iron parameters consistent with IDA.    Hx GERD.  Symptoms well controlled  with low-dose omeprazole.  Taking moderate doses of Aleve on a regular basis so could possibly have ulcer disease.  Thrombocytopenia, chronic compared with a year ago.  Mild, nonobstructive CAD per cath with normal LV function 07/2020.  Hypertension leading to cancellation of routine dental cleaning yesterday.   Currently normotensive without tachycardia.   PLAN:     Check INR.  Await results of c-Met, CBC.   EGD, colonoscopy.  Tomorrow, 9 TBD.  Okay for clear liquids now.  See orders for bowel prep.   Azucena Freed   07/09/2021, 10:31 AM Phone [phone]

## 2021-07-09 NOTE — Progress Notes (Signed)
Placed patient on home CPAP for the night  

## 2021-07-09 NOTE — ED Provider Notes (Signed)
[facility] EMERGENCY DEPARTMENT Provider Note   CSN: [id number] Arrival date & time: 07/08/21  1537     History  Chief Complaint  Patient presents with   Hypertension    Alexander [name] is a 71 y.o. male.  HPI     71 year old male comes in with chief complaint of elevated blood pressure, shortness of breath.  Patient has history of hypertension, hyperlipidemia, GERD, diabetes.  He indicates that he had gone to see his dentist, his blood pressure was elevated and they advised that he come to the ER.  Patient has been taking his BP medications.  He denies any chest pain.  He does indicate however that he has been having shortness of breath with exertion and episodes of dizziness.  No history of PE, DVT.  He denies any bloody or dark stools, bloody urine.  Patient denies any orthopnea, PND.  No active cancer, but has a history of skin cancer.  Home Medications Prior to Admission medications   Medication Sig Start Date End Date Taking? Authorizing Provider  acetaminophen (TYLENOL) 500 MG tablet Take 1,000 mg by mouth every 6 (six) hours as needed for mild pain.   Yes [provider]  albuterol (VENTOLIN HFA) 108 (90 Base) MCG/ACT inhaler Inhale 2 puffs into the lungs every 6 (six) hours as needed for wheezing or shortness of breath. 07/07/21  Yes [provider]  allopurinol (ZYLOPRIM) 300 MG tablet Take 300 mg by mouth at bedtime.   Yes [provider]  aspirin EC 81 MG tablet Take 81 mg by mouth every evening.   Yes [provider]  fexofenadine (ALLEGRA) 180 MG tablet Take 180 mg by mouth daily as needed (allergies).   Yes [provider]  gabapentin (NEURONTIN) 100 MG capsule Take 100 mg by mouth daily as needed (nerve pain).   Yes [provider]  ibuprofen (ADVIL) 200 MG tablet Take 400 mg by mouth every 6 (six) hours as needed for headache or mild pain.   Yes [provider]  levofloxacin (LEVAQUIN) 500  MG tablet Take 500 mg by mouth daily. For 10 days 07/07/21  Yes [provider]  metFORMIN (GLUCOPHAGE-XR) 500 MG 24 hr tablet Take 500 mg by mouth 2 (two) times daily. 05/27/21  Yes [provider]  metoprolol succinate (TOPROL-XL) 50 MG 24 hr tablet Take 50 mg by mouth at bedtime. 02/10/19  Yes [provider]  omeprazole (PRILOSEC) 20 MG capsule Take 20 mg by mouth every evening.   Yes [provider]  rosuvastatin (CRESTOR) 5 MG tablet Take 1 tablet (5 mg total) by mouth 3 (three) times a week. Patient taking differently: Take 5 mg by mouth at bedtime. 08/25/20  Yes Burnell Blanks, MD      Allergies    Contrast media [iodinated contrast media] and Gadolinium    Review of Systems   Review of Systems  All other systems reviewed and are negative.  Physical Exam Updated Vital Signs BP (!) 166/79    Pulse 80    Temp 97.8 F (36.6 C) (Oral)    Resp 19    Ht 5\' 11"  (1.803 m)    Wt 114.8 kg    SpO2 96%    BMI 35.29 kg/m  Physical Exam Vitals and nursing note reviewed.  Constitutional:      Appearance: He is well-developed.  HENT:     Head: Atraumatic.  Cardiovascular:     Rate and Rhythm: Normal rate.  Pulmonary:     Effort: Pulmonary effort is normal.  Abdominal:     General: There is no distension.     Palpations: Abdomen is soft.     Tenderness: There is no abdominal tenderness.  Musculoskeletal:     Cervical back: Neck supple.     Right lower leg: No edema.     Left lower leg: No edema.  Skin:    General: Skin is warm.  Neurological:     Mental Status: He is alert and oriented to person, place, and time.    ED Results / Procedures / Treatments   Labs (all labs ordered are listed, but only abnormal results are displayed) Labs Reviewed  CBC WITH DIFFERENTIAL/PLATELET - Abnormal; Notable for the following components:      Result Value   RBC 3.71 (*)    Hemoglobin 8.0 (*)    HCT 28.2 (*)    MCV 76.0 (*)    MCH 21.6 (*)    MCHC  28.4 (*)    RDW 19.0 (*)    Platelets 113 (*)    All other components within normal limits  BASIC METABOLIC PANEL - Abnormal; Notable for the following components:   CO2 21 (*)    Glucose, Bld 114 (*)    All other components within normal limits  BRAIN NATRIURETIC PEPTIDE - Abnormal; Notable for the following components:   B Natriuretic Peptide 111.7 (*)    All other components within normal limits  IRON AND TIBC - Abnormal; Notable for the following components:   Iron 16 (*)    Saturation Ratios 4 (*)    All other components within normal limits  FERRITIN - Abnormal; Notable for the following components:   Ferritin 4 (*)    All other components within normal limits  RETICULOCYTES - Abnormal; Notable for the following components:   RBC. 3.42 (*)    Immature Retic Fract 37.1 (*)    All other components within normal limits  RESP PANEL BY RT-PCR (FLU A&B, COVID) ARPGX2  MAGNESIUM  VITAMIN B12  FOLATE  CBC WITH DIFFERENTIAL/PLATELET  COMPREHENSIVE METABOLIC PANEL  PROTIME-INR  TROPONIN I (HIGH SENSITIVITY)  TROPONIN I (HIGH SENSITIVITY)    EKG EKG Interpretation  Date/Time:  Thursday July 09 2021 08:31:26 EST Ventricular Rate:  75 PR Interval:  225 QRS Duration: 92 QT Interval:  404 QTC Calculation: 452 R Axis:   68 Text Interpretation: Sinus rhythm Prolonged PR interval Low voltage, extremity leads No acute changes No significant change since last tracing Confirmed by Varney Biles [phone]) on 07/09/2021 10:48:14 AM  Radiology DG Chest 2 View  Result Date: 07/08/2021 CLINICAL DATA:  Chest pain, dyspnea on exertion EXAM: CHEST - 2 VIEW COMPARISON:  07/07/2021 FINDINGS: Heart size upper normal. Vascularity normal. Negative for heart failure or edema. Lungs are clear without infiltrate or effusion. IMPRESSION: No active cardiopulmonary disease. Electronically Signed   By: Franchot Gallo M.D.   On: 07/08/2021 18:30    Procedures .Critical Care Performed by: Varney Biles, MD Authorized by: Varney Biles, MD   Critical care provider statement:    Critical care time (minutes):  45   Critical care was necessary to treat or prevent imminent or life-threatening deterioration of the following conditions:  Circulatory failure   Critical care was time spent personally by me on the following activities:  Development of treatment plan with patient or surrogate, discussions with consultants, evaluation of patient's response to treatment, examination of patient, ordering and review of laboratory  studies, ordering and review of radiographic studies, ordering and performing treatments and interventions, pulse oximetry, re-evaluation of patient's condition, review of old charts and obtaining history from patient or surrogate    Medications Ordered in ED Medications  bisacodyl (DULCOLAX) EC tablet 10 mg (has no administration in time range)  pantoprazole (PROTONIX) 80 mg /NS 100 mL IVPB (80 mg Intravenous New Bag/Given 07/09/21 1003)    ED Course/ Medical Decision Making/ A&P Clinical Course as of 07/09/21 1151  Thu Jul 09, 2021  1046 Hemoglobin(!): 8.0 Patient's hemoglobin is 8.  Reviewed prior hemoglobin which was over 10.  Over the course of last 4 to 5 years, there has been a slow and gradual decline in the hemoglobin from normal range to 8.  I suspect his shortness of breath with exertion is because of symptomatic anemia. [AN]  1046 B Natriuretic Peptide(!): 111.7 Slightly elevated, but nonspecific.  No orthopnea, PND and chest x-ray shows no volume overload, exam does not show any volume overload. [AN]  5852 Basic metabolic panel(!) Patient's creatinine is normal, metabolic profile is overall reassuring [AN]  1047 Platelets(!): 113 Thrombocytopenia appears to be chronic [AN]    Clinical Course User Index [AN] Varney Biles, MD                           Medical Decision Making Amount and/or Complexity of Data Reviewed Labs: ordered. Decision-making  details documented in ED Course.  Risk Decision regarding hospitalization.   This patient presents to the ED with chief complaint(s) of elevated blood pressure, exertional shortness of breath with pertinent past medical history of hypertension, diabetes, OSA, stroke which further complicates the presenting complaint. The complaint involves an extensive differential diagnosis and treatment options and also carries with it a high risk of complications and morbidity.    The differential diagnosis includes : Acute coronary syndrome, CHF, blood loss anemia/symptomatic anemia, renal failure, pleural effusion, pulmonary edema  The initial work-up included basic labs, chest x-ray.   Additional history obtained: Additional history obtained from spouse  Records reviewed previous visit with cardiology on April 2022 and also cardiac catheterization that was done in 2022 which did not reveal clinically significant coronary artery disease.  Reassessment and review (also see workup area): Lab Tests: I Ordered, and personally interpreted labs.  The pertinent results include: Anemia with hemoglobin of 8.  Rest of the findings are under the work-up tab.  Imaging Studies: I ordered and independently visualized and interpreted the following imaging X-ray of the chest   which showed did not reveal any evidence of pulmonary edema or pleural effusion The interpretation of the imaging was limited to assessing for emergent pathology, for which purpose it was ordered.  Consultations Obtained: I requested consultation with the consultant GI team , and discussed  findings as well as pertinent plan - they recommend: Admission to the hospital with likely endoscopy to follow.  Consulted medicine for admission.  Cardiac Monitoring: The patient was maintained on a cardiac monitor.  I personally viewed and interpreted the cardiac monitor which showed an underlying rhythm of:  sinus rhythm  Complexity of problems  addressed: Patients presentation is most consistent with  acute presentation with potential threat to life or bodily function and exacerbation of chronic illness During patient's assessment  I considered blood transfusion for symptomatic anemia, however symptoms likely multifactorial and patient has a chronic process.  Will defer transfusion to admitting team who will likely get serial CBC.  Disposition: After consideration of the diagnostic results and the patients response to treatment,  I feel that the patent would benefit from admission   .    Final Clinical Impression(s) / ED Diagnoses Final diagnoses:  Symptomatic anemia  Thrombocytopenia ([place])    Rx / DC Orders ED Discharge Orders     None         Varney Biles, MD 07/09/21 1151

## 2021-07-09 NOTE — Plan of Care (Signed)
  Problem: Activity: Goal: Risk for activity intolerance will decrease Outcome: Progressing   

## 2021-07-09 NOTE — H&P (Addendum)
NAME:  Alexander [name], MRN:  [id number], DOB:  1951/05/16, LOS: 0 ADMISSION DATE:  07/08/2021, Primary: Monico Blitz, MD  CHIEF COMPLAINT:  shortness of breath   Medical Service: Internal Medicine Teaching Service         Attending Physician: Dr. Lucious Groves, DO    First Contact: Dr. Jeanice Lim Pager: [phone]  Second Contact: Dr. Johnney Ou Pager: [phone]       After Hours (After 5p/  First Contact Pager: [phone]  weekends / holidays): Second Contact Pager: [phone]    History of present illness   Alexander [name] is a 71 year old male with history as noted below who presents for progressive exertional dyspnea over the past 3-4 months. Associated symptoms include decreased appetite, melena and left flank pain. He notes that his stools have been getting darker over the past 2 months. No change in bowel habits. Left flank pain is relatively new over the past 1-2 months and notes that it is different from his chronic back pain. Does not radiate. No aggrevating/alleviating factors. Has not worsened over the past few months.  He notes that he experiences night sweats although this has been going on for several years and has not changed.  He takes about 8-10 ibuprofen every other day for chronic back pain. Last colonoscopy was about 10-15 years ago and was reportedly normal.  No personal or family history of GI malignancy.  Denies tobacco or alcohol use for >20 years.   He denies associated chest pain or cough. He underwent LHC in March 2022 which showed mild non-obstructive CAD.  Past Medical History  He,  has a past medical history of Chest pain, DDD (degenerative disc disease), cervical, Diabetes ([place]), GERD (gastroesophageal reflux disease), Gout, History of kidney stones, Hyperlipidemia, Hypertension, OSA (obstructive sleep apnea), Palpitations, Skin cancer, and Stroke ([place]).   Home Medications     Prior to Admission medications   Medication Sig Start Date End Date Taking? Authorizing  Provider  acetaminophen (TYLENOL) 500 MG tablet Take 1,000 mg by mouth every 6 (six) hours as needed for mild pain.   Yes [provider]  albuterol (VENTOLIN HFA) 108 (90 Base) MCG/ACT inhaler Inhale 2 puffs into the lungs every 6 (six) hours as needed for wheezing or shortness of breath. 07/07/21  Yes [provider]  allopurinol (ZYLOPRIM) 300 MG tablet Take 300 mg by mouth at bedtime.   Yes [provider]  aspirin EC 81 MG tablet Take 81 mg by mouth every evening.   Yes [provider]  fexofenadine (ALLEGRA) 180 MG tablet Take 180 mg by mouth daily as needed (allergies).   Yes [provider]  gabapentin (NEURONTIN) 100 MG capsule Take 100 mg by mouth daily as needed (nerve pain).   Yes [provider]  ibuprofen (ADVIL) 200 MG tablet Take 400 mg by mouth every 6 (six) hours as needed for headache or mild pain.   Yes [provider]  levofloxacin (LEVAQUIN) 500 MG tablet Take 500 mg by mouth daily. For 10 days 07/07/21  Yes [provider]  metFORMIN (GLUCOPHAGE-XR) 500 MG 24 hr tablet Take 500 mg by mouth 2 (two) times daily. 05/27/21  Yes [provider]  metoprolol succinate (TOPROL-XL) 50 MG 24 hr tablet Take 50 mg by mouth at bedtime. 02/10/19  Yes [provider]  omeprazole (PRILOSEC) 20 MG capsule Take 20 mg by mouth every evening.   Yes [provider]  rosuvastatin (CRESTOR) 5 MG tablet Take  1 tablet (5 mg total) by mouth 3 (three) times a week. Patient taking differently: Take 5 mg by mouth at bedtime. 08/25/20  Yes Burnell Blanks, MD    Allergies    Allergies as of 07/08/2021 - Review Complete 07/08/2021  Allergen Reaction Noted   Contrast media [iodinated contrast media] Other (See Comments) 01/14/2016   Gadolinium Other (See Comments) 05/15/2010    Social History   reports that he quit smoking about 38 years ago. His smoking use included cigarettes. He has never used  smokeless tobacco. He reports that he does not drink alcohol and does not use drugs.   Family History   His family history includes Cancer in his mother; Heart attack in his father.   ROS  10 point review of systems negative unless stated in the HPI.  Objective   Blood pressure (!) 145/73, pulse 78, temperature 97.8 F (36.6 C), temperature source Oral, resp. rate 14, height _0  (1.803 m), weight 114.8 kg, SpO2 97 %.    General: well appearing, no distress HEENT: head atraumatic, MMM Eyes: no scleral icterus or conjunctival injection Cardiac: RRR, no LE edema Pulm: breathing comfortably on room air. Lung sounds are clear.  GI: abdomen mildly distended, soft, mild epigastric discomfort. Bs active Skin: pallor. No rash Neuro: a/o x4 MSK: normal muscle bulk and tone Significant Diagnostic Tests:   CXR: no acute cardiopulmonary process  CBC Latest Ref Rng & Units 07/09/2021 07/08/2021 07/21/2020  WBC 4.0 - 10.5 K/uL 4.8 7.4 5.4  Hemoglobin 13.0 - 17.0 g/dL 7.5(L) 8.0(L) 10.4(L)  Hematocrit 39.0 - 52.0 % 25.8(L) 28.2(L) 32.6(L)  Platelets 150 - 400 K/uL 100(L) 113(L) 95(LL)   BMP Latest Ref Rng & Units 07/09/2021 07/08/2021 07/21/2020  Glucose 70 - 99 mg/dL 134(H) 114(H) 153(H)  BUN 8 - 23 mg/dL _1 Creatinine 0.61 - 1.24 mg/dL 0.92 1.02 0.98  BUN/Creat Ratio 10 - 24 - - 14  Sodium 135 - 145 mmol/L 138 138 138  Potassium 3.5 - 5.1 mmol/L 3.8 3.6 3.8  Chloride 98 - 111 mmol/L 106 104 102  CO2 22 - 32 mmol/L 24 21(L) 27  Calcium 8.9 - 10.3 mg/dL 8.9 9.3 9.9   Hepatic Function Latest Ref Rng & Units 07/09/2021 07/17/2012 05/15/2010  Total Protein 6.5 - 8.1 g/dL 6.7 8.3 7.0  Albumin 3.5 - 5.0 g/dL 3.3(L) 3.8 3.4(L)  AST 15 - 41 U/L 27 48(H) 58(H)  ALT 0 - 44 U/L 19 38 43  Alk Phosphatase 38 - 126 U/L 57 75 62  Total Bilirubin 0.3 - 1.2 mg/dL 0.7 0.5 1.2  Bilirubin, Direct 0.0 - 0.3 mg/dL - - -   BNP 112 Troponin 9>>8 Ferritin 4; TIBC 445; saturation 4; iron 16 Retics:  elevated Summary  71 year old male with diabetes, hyperlipidemia, hypertension, and history of stroke who presents for exertional chest pain, shortness of breath and chest pain and was found to have acute on chronic iron deficiency anemia. IMTS asked to admit for further evaluation and management.   Assessment & Plan:   Symptomatic acute on chronic iron deficiency anemia. Suspect GI source; possibly upper in the setting of ibuprofen use GI consulted in the ED Plan for EGD/colonoscopy tomorrow If endoscopies are unrevealing, would consider abdominal CT to evaluate LLQ pain Protonix 58m BID Transfuse feraheme Continue to monitor hemoglobins. Transfuse for hemoglobin <7 Hold pharmacological VTE prophylaxis. SCDs ordered  Hypertension. Continue metoprolol  Mild non-obstructive CAD. Last cath 2022. Continue statin therapy  and metoprolol  Chronic thrombocytopenia. Stable. Monitor.   Type 2 Diabetes mellitus.  Hold metformin CBGs with meals. Add SSI if needed Check A1C  Gout. Continue allopurinol.  Best practice:  CODE STATUS: Full DVT for prophylaxis: SCD Dispo: Admit patient to Observation with expected length of stay less than 2 midnights.   Mitzi Hansen, MD Internal Medicine Resident PGY-3 [facility] Pager: [phone] 07/09/2021 2:24 PM

## 2021-07-09 NOTE — Consult Note (Addendum)
Attending physician's note   I have taken a history, reviewed the chart, and examined the patient. I performed a substantive portion of this encounter, including complete performance of at least one of the key components, in conjunction with the APP. I agree with the APP's note, impression, and recommendations with my edits.   71 year old male with medical history as outlined below presenting with SOB, DOE, fatigue, orthopnea with elevated BP noted at his dentist office.  Was recently treated for suspected pneumonia on 2/13 with albuterol inhaler and Levaquin.  Has been taking Aleve for left back/flank pain.  Denies melena, hematochezia.  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  Admission evaluation notable for the following: - H/H 8/20.2 with MCV/RDW 76/19 - BUN/creatinine 12/1 - BNP 111 - Troponin negative x2 - Ferritin 4, iron 16, TIBC 445, sat 4% - B12 284, folate 19 - Chest x-ray normal   On review of prior labs, he has been having a downtrending H/H and declining MCV as follows: - 2017: H/H 15/46 with MCV/RDW 93/15 - 02/2020: H/H11.5/35.4 with MCV/RDW 79/16 - 06/2020: H/H 10.4/32.6 with MCV/RDW 76/19  1) Symptomatic iron deficiency anemia 2) Nausea/Vomiting - Suspect he has been having slow, occult bleed for some time now, as he has been having declining H/H since at least 2021, with decreasing MCV and expanding RDW. - RBC transfusion for Hgb <7 - Holding aspirin; stopped other NSAIDs - Clears okay today with n.p.o. at midnight - Started on high-dose PPI - Plan for EGD/colonoscopy tomorrow for diagnostic and therapeutic intent - Will benefit from dose of IV iron on this admission  3) CAD - Holding aspirin  4) Chronic thrombocytopenia - Stable PLT at 113 on arrival  5) GERD - Well-controlled on outpatient  omeprazole  The indications, risks, and benefits of EGD and colonoscopy were explained to the patient in detail. Risks include but are not limited to bleeding, perforation, adverse reaction to medications, and cardiopulmonary compromise. Sequelae include but are not limited to the possibility of surgery, hospitalization, and mortality. The patient verbalized understanding and wished to proceed. All questions answered. Further recommendations pending results of the exam.    Cephas Darby, FACG [phone] office                                                                                   [facility] Consult: 10:31 AM 07/09/2021  LOS: 0 days    Referring Provider: Dr Kathrynn Humble  Primary Care Physician:  Monico Blitz, MD Primary Gastroenterologist:  unassigned    Reason for Consultation: Anemia.     HPI: ISMEAL HEIDER is a 71 y.o. male.  PMH OSA, intolerant of CPAP.  Small stroke 2011.  LVEF 60 to 65% per echo in 07/2020.  Mild, nonobstructive CAD, normal LV function on cardiac cath 07/2020.  NIDDM. Microcytic anemia.  GERD.  Gout.  Surgeries include hemorrhoidectomy, orthopedic surgeries. Colonoscopy and EGD in 12 or 15 years ago, patient recalls no specific pathology.  Reports that he used to suffer a lot from heartburn but low-dose Prilosec has resolved that issue.  Dentist noted  blood pressure reading 190/90, arms subsequently canceled planned dental cleaning.  Experiencing shortness of breath, fatigue, insomnia, tachycardia with activities and at rest for several days, has progressed recently.  Some gurgling sensation but not pain in the upper abdomen.  No angina.  Seen at PCP 2/13 with suspicion of pneumonia, chest x-ray results not available but received albuterol inhaler and Levaquin prescription which he started on 2/14.  Takes 1-2 Aleve per day prn  For at least 1 week, taking Aleve on a daily basis for left back/flank pain.  A month ago he had 3 or 4 days of  nausea, vomiting with no CGE or blood.  Does not see black, tarry, or bloody stools.  Mostly has daily bowel movements.  Appetite is good.  Weight stable though he lost and then gained back a few pounds w the N/V a month ago.  Hgb 8, was 10.4 a year ago.  MCV 76, same as a year ago..  Platelets 113, 95 a year ago. Low iron at 16, low iron sats, low ferritin at 4, normal TIBC.  B12 and folate within normal limits. High-sensitivity troponin normal: 9... 8.  MP slightly elevated at 111. BUNs/creatinine/GFR normal.  C-Met and lipid LFTs are ordered but not yet collected. CXR unremarkable.  Has not smoked cigarettes or drank alcohol since mid 1980s.  Not a heavy drinker prior to that.  Family history negative for colorectal cancer, ulcers, anemia, IBD. 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  Past Medical History:  Diagnosis Date   Chest pain    a. 10.2006 Cath: nl cors, EF 65%;  b. 12/2008 Negative myoview.   DDD (degenerative disc disease), cervical    a. 08/2008 s/p R L4-5 diskectomy, foaminotomy, and lysis of adhesions.   Diabetes ([place])    GERD (gastroesophageal reflux disease)    Gout    History of kidney stones    Hyperlipidemia    Hypertension    OSA (obstructive sleep apnea)    a. on cpap  NOT ABLE TO USE   Palpitations    a. 11/2008 - nl holter monitor;  b. 04/2010 Echo: EF 50-55%, Gr 1 DD.   Skin cancer    Stroke [facility])    a. 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    Past Surgical History:  Procedure Laterality Date   APPENDECTOMY     BACK SURGERY     CATARACT EXTRACTION     RIGHT   FOOT SURGERY     HEMORROIDECTOMY     KNEE SURGERY     LEFT HEART CATHETERIZATION WITH CORONARY ANGIOGRAM N/A 07/18/2012   Procedure: LEFT HEART CATHETERIZATION WITH CORONARY ANGIOGRAM;  Surgeon: Burnell Blanks, MD;  [facility] CATH LAB;  Service: Cardiovascular;  Laterality: N/A;   LUMBAR LAMINECTOMY/DECOMPRESSION MICRODISCECTOMY Left 01/27/2016   Procedure: Left Lumbar Four-Five, Lumbar  FIve-Sacral One Laminectomy/Foraminotomy;  Surgeon: Leeroy Cha, MD;  Location: [facility] NEURO ORS;  Service: Neurosurgery;  Laterality: Left;  Left L4-5 L5-S1 Laminectomy/Foraminotomy   RIGHT/LEFT HEART CATH AND CORONARY ANGIOGRAPHY N/A 07/25/2020   Procedure: RIGHT/LEFT HEART CATH AND CORONARY ANGIOGRAPHY;  Surgeon: Burnell Blanks, MD;  Location: [facility] CV LAB;  Service: Cardiovascular;  Laterality: N/A;   TONSILLECTOMY      Prior to Admission medications   Medication Sig Start Date End Date Taking? Authorizing Provider  acetaminophen (TYLENOL) 500 MG tablet Take 1,000 mg by mouth every 6 (six) hours as needed for mild pain.   Yes [provider]  albuterol (VENTOLIN HFA) 108 (90  Base) MCG/ACT inhaler Inhale 2 puffs into the lungs every 6 (six) hours as needed for wheezing or shortness of breath. 07/07/21  Yes [provider]  allopurinol (ZYLOPRIM) 300 MG tablet Take 300 mg by mouth at bedtime.   Yes [provider]  aspirin EC 81 MG tablet Take 81 mg by mouth every evening.   Yes [provider]  fexofenadine (ALLEGRA) 180 MG tablet Take 180 mg by mouth daily as needed (allergies).   Yes [provider]  gabapentin (NEURONTIN) 100 MG capsule Take 100 mg by mouth daily as needed (nerve pain).   Yes [provider]  ibuprofen (ADVIL) 200 MG tablet Take 400 mg by mouth every 6 (six) hours as needed for headache or mild pain.   Yes [provider]  levofloxacin (LEVAQUIN) 500 MG tablet Take 500 mg by mouth daily. For 10 days 07/07/21  Yes [provider]  metFORMIN (GLUCOPHAGE-XR) 500 MG 24 hr tablet Take 500 mg by mouth 2 (two) times daily. 05/27/21  Yes [provider]  metoprolol succinate (TOPROL-XL) 50 MG 24 hr tablet Take 50 mg by mouth at bedtime. 02/10/19  Yes [provider]  omeprazole (PRILOSEC) 20 MG capsule Take 20 mg by mouth every evening.   Yes [provider]  rosuvastatin  (CRESTOR) 5 MG tablet Take 1 tablet (5 mg total) by mouth 3 (three) times a week. Patient taking differently: Take 5 mg by mouth at bedtime. 08/25/20  Yes Burnell Blanks, MD    Scheduled Meds:  Infusions:  PRN Meds:    Allergies as of 07/08/2021 - Review Complete 07/08/2021  Allergen Reaction Noted   Contrast media [iodinated contrast media] Other (See Comments) 01/14/2016   Gadolinium Other (See Comments) 05/15/2010    Family History  Problem Relation Age of Onset   Cancer Mother    Heart attack Father     Social History   Socioeconomic History   Marital status: Married    Spouse name: Not on file   Number of children: 1   Years of education: Not on file   Highest education level: Not on file  Occupational History   Occupation: He owns a [employer]  Tobacco Use   Smoking status: Former    Types: Cigarettes    Quit date: 05/25/1983    Years since quitting: 38.1   Smokeless tobacco: Never   Tobacco comments:    quit smoking in 1986  Substance and Sexual Activity   Alcohol use: No    Comment: former - quit driking in 1983.   Drug use: No   Sexual activity: Not on file  Other Topics Concern   Not on file  Social History Narrative   Lives in [place], [place] with spouse.  Works as a Administrator.   Social Determinants of Health   Financial Resource Strain: Not on file  Food Insecurity: Not on file  Transportation Needs: Not on file  Physical Activity: Not on file  Stress: Not on file  Social Connections: Not on file  Intimate Partner Violence: Not on file    REVIEW OF SYSTEMS: Constitutional: Fatigue ENT:  No nose bleeds Pulm: Shortness of breath without cough CV: Positive tacky palpitations.  No angina. GU:  + frequency and nocturia.  No obvious blood in the urine. GI: No dysphagia.  See HPI. Heme: Denies unusual or excessive bleeding or bruising. Transfusions: Not ever. Neuro: Dull sense of head discomfort that he says is not even severe  enough  to call a headache. Derm:  No itching, no rash or sores.  Endocrine:  No sweats or chills.  No polyuria or dysuria Immunization: Not queried. Travel: Not queried.   PHYSICAL EXAM: Vital signs in last 24 hours: Vitals:   07/09/21 1000 07/09/21 1015  BP: (!) 149/82 (!) 166/79  Pulse: 70 80  Resp: 15 19  Temp:    SpO2: 97% 96%   Wt Readings from Last 3 Encounters:  07/08/21 114.8 kg  08/25/20 114.8 kg  07/25/20 114.8 kg    General: Obese, comfortable, not ill-appearing.  Alert Head: No facial asymmetry or swelling.  No signs of head trauma. Eyes: No conjunctival pallor.  No scleral icterus. Ears: Hearing aid in place Nose: No congestion or discharge Mouth: Oral mucosa pink, moist, clear.  Tongue midline. Neck: No JVD, no thyromegaly Lungs: Clear bilaterally without labored breathing. Heart: RRR.  No MRG.  S1, S2 present Abdomen: Soft, slight distention.  Active bowel sounds.  Minor discomfort in epigastric region without guarding or rebound.  No HSM, masses, bruits, hernias.   Rectal: No visible or palpable masses.  Vault empty, no stool or blood. Musc/Skeltl: No joint redness, swelling or significant deformity. Extremities: No lower extremity edema Neurologic: Alert.  Detailed historian.  Oriented x3.  Moves all 4 limbs without tremor, strength not tested Skin: No telangiectasia, rash, sores or suspicious lesions Nodes: No cervical adenopathy Psych: Garrulous, pleasant affect.  Fluid speech  Intake/Output from previous day: No intake/output data recorded. Intake/Output this shift: No intake/output data recorded.  LAB RESULTS: Recent Labs    07/08/21 1655  WBC 7.4  HGB 8.0*  HCT 28.2*  PLT 113*   BMET Lab Results  Component Value Date   NA 138 07/08/2021   NA 138 07/21/2020   NA 137 01/20/2016   K 3.6 07/08/2021   K 3.8 07/21/2020   K 4.1 01/20/2016   CL 104 07/08/2021   CL 102 07/21/2020   CL 99 (L) 01/20/2016   CO2 21 (L) 07/08/2021   CO2 27  07/21/2020   CO2 30 01/20/2016   GLUCOSE 114 (H) 07/08/2021   GLUCOSE 153 (H) 07/21/2020   GLUCOSE 109 (H) 01/20/2016   BUN 12 07/08/2021   BUN 14 07/21/2020   BUN 10 01/20/2016   CREATININE 1.02 07/08/2021   CREATININE 0.98 07/21/2020   CREATININE 0.97 01/20/2016   CALCIUM 9.3 07/08/2021   CALCIUM 9.9 07/21/2020   CALCIUM 9.8 01/20/2016   LFT No results for input(s): PROT, ALBUMIN, AST, ALT, ALKPHOS, BILITOT, BILIDIR, IBILI in the last 72 hours. PT/INR Lab Results  Component Value Date   INR 1.08 07/17/2012   INR 1.18 05/15/2010   INR 1.15 05/15/2010   Hepatitis Panel No results for input(s): HEPBSAG, HCVAB, HEPAIGM, HEPBIGM in the last 72 hours. C-Diff No components found for: CDIFF Lipase  No results found for: LIPASE  Drugs of Abuse  No results found for: LABOPIA, COCAINSCRNUR, LABBENZ, AMPHETMU, THCU, LABBARB   RADIOLOGY STUDIES: DG Chest 2 View  Result Date: 07/08/2021 CLINICAL DATA:  Chest pain, dyspnea on exertion EXAM: CHEST - 2 VIEW COMPARISON:  07/07/2021 FINDINGS: Heart size upper normal. Vascularity normal. Negative for heart failure or edema. Lungs are clear without infiltrate or effusion. IMPRESSION: No active cardiopulmonary disease. Electronically Signed   By: Franchot Gallo M.D.   On: 07/08/2021 18:30     IMPRESSION:   Symptomatic anemia.  Microcytic.  Iron parameters consistent with IDA.    Hx GERD.  Symptoms well controlled  with low-dose omeprazole.  Taking moderate doses of Aleve on a regular basis so could possibly have ulcer disease.  Thrombocytopenia, chronic compared with a year ago.  Mild, nonobstructive CAD per cath with normal LV function 07/2020.  Hypertension leading to cancellation of routine dental cleaning yesterday.   Currently normotensive without tachycardia.   PLAN:     Check INR.  Await results of c-Met, CBC.   EGD, colonoscopy.  Tomorrow, 9 TBD.  Okay for clear liquids now.  See orders for bowel prep.   Azucena Freed   07/09/2021, 10:31 AM Phone [phone]

## 2021-07-10 ENCOUNTER — Encounter (HOSPITAL_COMMUNITY): Payer: Self-pay | Admitting: Internal Medicine

## 2021-07-10 ENCOUNTER — Inpatient Hospital Stay (HOSPITAL_COMMUNITY): Payer: Medicare Other | Admitting: General Practice

## 2021-07-10 ENCOUNTER — Encounter (HOSPITAL_COMMUNITY)
Admission: EM | Disposition: A | Discharge status: Home / Self Care | Payer: Self-pay | Source: Home / Self Care | Attending: Internal Medicine

## 2021-07-10 DIAGNOSIS — D649 Anemia, unspecified: Secondary | ICD-10-CM | POA: Diagnosis not present

## 2021-07-10 DIAGNOSIS — K3189 Other diseases of stomach and duodenum: Secondary | ICD-10-CM | POA: Diagnosis not present

## 2021-07-10 DIAGNOSIS — K5520 Angiodysplasia of colon without hemorrhage: Secondary | ICD-10-CM

## 2021-07-10 DIAGNOSIS — D696 Thrombocytopenia, unspecified: Secondary | ICD-10-CM

## 2021-07-10 DIAGNOSIS — K31819 Angiodysplasia of stomach and duodenum without bleeding: Secondary | ICD-10-CM

## 2021-07-10 DIAGNOSIS — G8929 Other chronic pain: Secondary | ICD-10-CM | POA: Diagnosis present

## 2021-07-10 DIAGNOSIS — Z91041 Radiographic dye allergy status: Secondary | ICD-10-CM | POA: Diagnosis not present

## 2021-07-10 DIAGNOSIS — I10 Essential (primary) hypertension: Secondary | ICD-10-CM | POA: Diagnosis present

## 2021-07-10 DIAGNOSIS — Z7984 Long term (current) use of oral hypoglycemic drugs: Secondary | ICD-10-CM | POA: Diagnosis not present

## 2021-07-10 DIAGNOSIS — K635 Polyp of colon: Secondary | ICD-10-CM | POA: Diagnosis present

## 2021-07-10 DIAGNOSIS — M545 Low back pain, unspecified: Secondary | ICD-10-CM | POA: Diagnosis present

## 2021-07-10 DIAGNOSIS — I2510 Atherosclerotic heart disease of native coronary artery without angina pectoris: Secondary | ICD-10-CM | POA: Diagnosis present

## 2021-07-10 DIAGNOSIS — K621 Rectal polyp: Secondary | ICD-10-CM

## 2021-07-10 DIAGNOSIS — E785 Hyperlipidemia, unspecified: Secondary | ICD-10-CM | POA: Diagnosis present

## 2021-07-10 DIAGNOSIS — Z8249 Family history of ischemic heart disease and other diseases of the circulatory system: Secondary | ICD-10-CM | POA: Diagnosis not present

## 2021-07-10 DIAGNOSIS — E119 Type 2 diabetes mellitus without complications: Secondary | ICD-10-CM | POA: Diagnosis present

## 2021-07-10 DIAGNOSIS — Z20822 Contact with and (suspected) exposure to covid-19: Secondary | ICD-10-CM | POA: Diagnosis present

## 2021-07-10 DIAGNOSIS — Z87891 Personal history of nicotine dependence: Secondary | ICD-10-CM | POA: Diagnosis not present

## 2021-07-10 DIAGNOSIS — K219 Gastro-esophageal reflux disease without esophagitis: Secondary | ICD-10-CM | POA: Diagnosis present

## 2021-07-10 DIAGNOSIS — M109 Gout, unspecified: Secondary | ICD-10-CM | POA: Diagnosis present

## 2021-07-10 DIAGNOSIS — Z85828 Personal history of other malignant neoplasm of skin: Secondary | ICD-10-CM | POA: Diagnosis not present

## 2021-07-10 DIAGNOSIS — D509 Iron deficiency anemia, unspecified: Secondary | ICD-10-CM | POA: Diagnosis present

## 2021-07-10 DIAGNOSIS — D500 Iron deficiency anemia secondary to blood loss (chronic): Secondary | ICD-10-CM

## 2021-07-10 DIAGNOSIS — Z79899 Other long term (current) drug therapy: Secondary | ICD-10-CM | POA: Diagnosis not present

## 2021-07-10 DIAGNOSIS — Z7982 Long term (current) use of aspirin: Secondary | ICD-10-CM | POA: Diagnosis not present

## 2021-07-10 DIAGNOSIS — G4733 Obstructive sleep apnea (adult) (pediatric): Secondary | ICD-10-CM | POA: Diagnosis present

## 2021-07-10 DIAGNOSIS — Z8673 Personal history of transient ischemic attack (TIA), and cerebral infarction without residual deficits: Secondary | ICD-10-CM | POA: Diagnosis not present

## 2021-07-10 DIAGNOSIS — D126 Benign neoplasm of colon, unspecified: Secondary | ICD-10-CM

## 2021-07-10 HISTORY — PX: HOT HEMOSTASIS: SHX5433

## 2021-07-10 HISTORY — PX: BIOPSY: SHX5522

## 2021-07-10 HISTORY — PX: COLONOSCOPY WITH PROPOFOL: SHX5780

## 2021-07-10 HISTORY — PX: POLYPECTOMY: SHX5525

## 2021-07-10 HISTORY — PX: ESOPHAGOGASTRODUODENOSCOPY (EGD) WITH PROPOFOL: SHX5813

## 2021-07-10 LAB — BASIC METABOLIC PANEL
Anion gap: 8 (ref 5–15)
BUN: 10 mg/dL (ref 8–23)
CO2: 25 mmol/L (ref 22–32)
Calcium: 8.9 mg/dL (ref 8.9–10.3)
Chloride: 106 mmol/L (ref 98–111)
Creatinine, Ser: 1.01 mg/dL (ref 0.61–1.24)
GFR, Estimated: >60 mL/min (ref >60)
Glucose, Bld: 112 mg/dL — ABNORMAL HIGH (ref 70–99)
Potassium: 3.6 mmol/L (ref 3.5–5.1)
Sodium: 139 mmol/L (ref 135–145)

## 2021-07-10 LAB — CBC
HCT: 25.6 % — ABNORMAL LOW (ref 39.0–52.0)
Hemoglobin: 7.6 g/dL — ABNORMAL LOW (ref 13.0–17.0)
MCH: 22 pg — ABNORMAL LOW (ref 26.0–34.0)
MCHC: 29.7 g/dL — ABNORMAL LOW (ref 30.0–36.0)
MCV: 74 fL — ABNORMAL LOW (ref 80.0–100.0)
Platelets: 104 10*3/uL — ABNORMAL LOW (ref 150–400)
RBC: 3.46 MIL/uL — ABNORMAL LOW (ref 4.22–5.81)
RDW: 18.9 % — ABNORMAL HIGH (ref 11.5–15.5)
WBC: 5.6 10*3/uL (ref 4.0–10.5)
nRBC: 0 % (ref 0.0–0.2)

## 2021-07-10 SURGERY — COLONOSCOPY WITH PROPOFOL
Anesthesia: Monitor Anesthesia Care

## 2021-07-10 MED ORDER — LIDOCAINE 2% (20 MG/ML) 5 ML SYRINGE
INTRAMUSCULAR | Status: DC | PRN
Start: 2021-07-10 — End: 2021-07-10
  Administered 2021-07-10: 100 mg via INTRAVENOUS

## 2021-07-10 MED ORDER — LACTATED RINGERS IV SOLN
INTRAVENOUS | Status: DC
Start: 2021-07-10 — End: 2021-07-12

## 2021-07-10 MED ORDER — SODIUM CHLORIDE 0.9 % IV SOLN
250.0000 mg | Freq: Every day | INTRAVENOUS | Status: AC
  Administered 2021-07-10 – 2021-07-11 (×2): 250 mg via INTRAVENOUS
  Filled 2021-07-10 (×2): qty 20

## 2021-07-10 MED ORDER — PROPOFOL 500 MG/50ML IV EMUL
INTRAVENOUS | Status: DC | PRN
  Administered 2021-07-10: 125 ug/kg/min via INTRAVENOUS

## 2021-07-10 MED ORDER — PROPOFOL 10 MG/ML IV BOLUS
INTRAVENOUS | Status: DC | PRN
  Administered 2021-07-10 (×3): 20 mg via INTRAVENOUS

## 2021-07-10 SURGICAL SUPPLY — 25 items

## 2021-07-10 NOTE — Progress Notes (Signed)
°  Transition of Care [facility]) Screening Note   Patient Details  Name: Alexander [name] Date of Birth: 05-01-1951   Transition [facility]) CM/SW Contact:    Tom-Johnson, Renea Ee, RN Phone Number: 07/10/2021, 3:01 PM   Patient is admitted for Symptomatic anemia. Transition of Care Department [facility]) has reviewed patient and no TOC needs or recommendations have been identified at this time. TOC will continue to monitor patient advancement through interdisciplinary progression rounds. If new patient transition needs arise, please place a TOC consult.

## 2021-07-10 NOTE — Progress Notes (Signed)
°  Patient has not completed the second liter of MiraLAX.  About 250 mL remain.  Did not start the prep until late yesterday.  Initial stool output was brown, patient says the last bowel movement was clear which is confirmed by nurse.  No nausea.  Denies dizziness when he gets up to use the toilet. Vital signs stable, normotensive, heart rate normal, no fevers, no hypoxia. No overt bleeding (tarry stool, bloody stool, BPR). Hgb 8>> 7.6. Received Feraheme early this AM and daily Ferrlecit x 3 starting this AM.  Protonix 40 mg IV in place.   Spoke w RN and gave prn order for tap water enema if next stool is not clear.    Colonoscopy, EGD set for 2 PM today for eval of Microcytic anemia, IDA  S Jayce Kainz PA-C.

## 2021-07-10 NOTE — Op Note (Signed)
[facility] Patient Name: Alexander [name] Procedure Date : 07/10/2021 MRN: [id number] Attending MD: Carlota Raspberry. Havery Moros , MD Date of Birth: 05/26/1950 CSN: [id number] Age: 71 Admit Type: Outpatient Procedure:                Upper GI endoscopy Indications:              Iron deficiency anemia Providers:                Remo Lipps P. Havery Moros, MD, Jeanella Cara,                            RN, Luan Moore, Technician Referring MD:              Medicines:                Monitored Anesthesia Care Complications:            No immediate complications. Estimated blood loss:                            Minimal. Estimated Blood Loss:     Estimated blood loss was minimal. Procedure:                Pre-Anesthesia Assessment:                           - Prior to the procedure, a History and Physical                            was performed, and patient medications and                            allergies were reviewed. The patient's tolerance of                            previous anesthesia was also reviewed. The risks                            and benefits of the procedure and the sedation                            options and risks were discussed with the patient.                            All questions were answered, and informed consent                            was obtained. Prior Anticoagulants: The patient has                            taken no previous anticoagulant or antiplatelet                            agents. ASA Grade Assessment: III - A patient with  severe systemic disease. After reviewing the risks                            and benefits, the patient was deemed in                            satisfactory condition to undergo the procedure.                           After obtaining informed consent, the endoscope was                            passed under direct vision. Throughout the                            procedure, the  patient's blood pressure, pulse, and                            oxygen saturations were monitored continuously. The                            [id number]-[id number] ([id number] Olympus endoscope was introduced                            through the mouth, and advanced to the second part                            of duodenum. The upper GI endoscopy was                            accomplished without difficulty. The patient                            tolerated the procedure well. Scope In: Scope Out: Findings:      Esophagogastric landmarks were identified: the Z-line was found at 45       cm, the gastroesophageal junction was found at 45 cm and the upper       extent of the gastric folds was found at 45 cm from the incisors.      The exam of the esophagus was otherwise normal.      A single angiodysplastic lesion with no bleeding was found in the       gastric fundus. Fulguration to ablate the lesion to prevent bleeding by       argon plasma was successful.      Mild superficial vascular markings in the pyloric area, in a striped       distribution, with adherent heme was noted. I suspect this represents       mild gastric antral vascular ectasia. Fulguration to ablate the areas to       prevent bleeding by argon plasma was successful.      Patchy mildly erythematous mucosa was found in the gastric fundus and in       the gastric body. Biopsies were taken with a cold forceps for       Helicobacter pylori testing.      The exam of  the stomach was otherwise normal.      The duodenal bulb and second portion of the duodenum were normal. Impression:               - Esophagogastric landmarks identified.                           - Normal esophagus otherwise                           - A single non-bleeding angiodysplastic lesion in                            the stomach. Treated with argon plasma coagulation                            (APC).                           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                            Treated with argon plasma coagulation (APC).                           - Erythematous mucosa in the gastric fundus and                            gastric body. Biopsied.                           - Normal duodenal bulb and second portion of the                            duodenum.                           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 Recommendation:           - Return patient to hospital ward for ongoing care.                           - Advance diet as tolerated. Liquid diet tonight                            post APC, advance as tolerated tomorrow                           - Continue present medications.                           - Protonix 40mg  BID 1 month, can stop IV PPI                           -  Carafate 1gm tablet PO every 8 hours for the next                            week to treat post APC. This may make stool dark                           - Await pathology results.                           - Trend Hgb. Likely home in the AM in Hgb stable.                            Patient has received IV iron Procedure Code(s):        --- Professional ---                           16109, 4, Esophagogastroduodenoscopy, flexible,                            transoral; with control of bleeding, any method                           43239, Esophagogastroduodenoscopy, flexible,                            transoral; with biopsy, single or multiple Diagnosis Code(s):        --- Professional ---                           U04.540, Angiodysplasia of stomach and duodenum                            without bleeding                           K31.89, Other diseases of stomach and duodenum                           D50.9, Iron deficiency anemia, unspecified CPT copyright 2019 American Medical Association. All rights reserved. The codes documented in this report are preliminary and upon coder review may   be revised to meet current compliance requirements. Remo Lipps P. Deerica Waszak, MD 07/10/2021 3:18:16 PM This report has been signed electronically. Number of Addenda: 0

## 2021-07-10 NOTE — Anesthesia Postprocedure Evaluation (Signed)
Anesthesia Post Note  Patient: KALEB SEK  Procedure(s) Performed: COLONOSCOPY WITH PROPOFOL ESOPHAGOGASTRODUODENOSCOPY (EGD) WITH PROPOFOL HOT HEMOSTASIS (ARGON PLASMA COAGULATION/BICAP) BIOPSY POLYPECTOMY     Patient location during evaluation: Endoscopy Anesthesia Type: MAC Level of consciousness: awake Pain management: pain level controlled Vital Signs Assessment: post-procedure vital signs reviewed and stable Cardiovascular status: stable Postop Assessment: no apparent nausea or vomiting Anesthetic complications: no   No notable events documented.  Last Vitals:  Vitals:   07/10/21 1300 07/10/21 1506  BP: (!) 174/65 (!) 154/72  Pulse: 78 84  Resp: 15 16  Temp: 36.6 C 36.7 C  SpO2: 95% 95%    Last Pain:  Vitals:   07/10/21 1515  TempSrc:   PainSc: 0-No pain                 Sherree Shankman

## 2021-07-10 NOTE — Interval H&P Note (Signed)
History and Physical Interval Note: Patient without any overt bleeding with prep. He denies any cardiopulmonary symptoms. Hgb stable in 7s, has received IV Iron. I have discussed risks / benefits of endoscopic procedures and anesthesia with him and he wants to proceed, further recommendations pending the results. No interval changes since overnight.   07/10/2021 1:23 PM  [name]  has presented today for surgery, with the diagnosis of Anemia.  Abdominal discomfort.  FOB status yet to be determined..  The various methods of treatment have been discussed with the patient and family. After consideration of risks, benefits and other options for treatment, the patient has consented to  Procedure(s): COLONOSCOPY WITH PROPOFOL (N/A) ESOPHAGOGASTRODUODENOSCOPY (EGD) WITH PROPOFOL (N/A) as a surgical intervention.  The patient's history has been reviewed, patient examined, no change in status, stable for surgery.  I have reviewed the patient's chart and labs.  Questions were answered to the patient's satisfaction.     [place]

## 2021-07-10 NOTE — Progress Notes (Signed)
° °  Subjective: I seen and evaluated Alexander [name] at bedside.  He was lying comfortably in bed.  He endorsed over the last several months fatigue, shortness of breath, palpitation especially after activity.  He is aware that he is anemic.  He is aware that he has a scheduled EGD/Colo today.  Otherwise, presently denies any complaints.  Objective:  Vital signs in last 24 hours: Vitals:   07/09/21 1852 07/09/21 2028 07/10/21 0249 07/10/21 0541  BP: (!) 150/75 (!) 154/78 (!) 115/43 (!) 141/66  Pulse: 85 84 76 76  Resp: 19 18 17 17   Temp: 97.7 F (36.5 C) 98.2 F (36.8 C) 97.9 F (36.6 C) 97.8 F (36.6 C)  TempSrc: Oral     SpO2: 97% 97% 94% 97%  Weight:      Height:       Physical Exam Constitutional:      General: He is not in acute distress. Cardiovascular:     Rate and Rhythm: Normal rate and regular rhythm.     Heart sounds: Normal heart sounds.  Pulmonary:     Effort: Pulmonary effort is normal.     Breath sounds: Normal breath sounds and air entry.  Musculoskeletal:     Right lower leg: No edema.     Left lower leg: No edema.  Skin:    General: Skin is warm and dry.  Neurological:     General: No focal deficit present.     Mental Status: He is alert and oriented to person, place, and time.  Psychiatric:        Behavior: Behavior normal. Behavior is cooperative.     Assessment/Plan:  Principal Problem:   Symptomatic anemia Active Problems:   Iron deficiency anemia   Anemia  JATAVIS MALEK is a 71 year old male with diabetes, hyperlipidemia, hypertension, and history of stroke who presents for exertional chest pain, shortness of breath and chest pain and was found to have acute on chronic iron deficiency anemia.  #Acute on chronic iron deficiency anemia #Chronic thrombocytopenia Iron studies reveal iron level 16; iron saturation 4; ferritin 4.  Since admission patient received IV Feraheme 510 mg.  Patient's iron deficit 2666 mg.  Patient's last colonoscopy was over 15  years ago, patient reports.  Hemoglobin steady at 7.6.  PT INR within normal limits.  Patient endorses heavy NSAID use secondary to chronic low back pain.  Likely source of slow bleeding.  He endorsed intermittent dark stools over the last several months.  Scheduled for EGD/colonoscopy today.  We will continue iron supplementation. --N.p.o. --Hold ASA --Avoid NSAIDs; Tylenol 1000 mg every 6 hours as needed --IV Protonix 40 mg twice daily; Protonix 80 mg/NS 100 mL IVPB -- IV Ferrlecit 250mg  dose today and tomorrow --Transfuse for hemoglobin <7 -- Trend CBC  Hypertension CAD Last cath 2022.  Home medications include metoprolol succinate 50 mg daily; rosuvastatin 5 mg daily --Continue home medication  Type 2 diabetes mellitus with complication X7D obtained on admission, 7.2%; glucose readings within normal limits <120 -- Hold metformin --Gabapentin 100 mg daily as needed  Gout -- Allopurinol 300 mg daily at bedtime  DVT prophylaxis: SCDs  Prior to Admission Living Arrangement: Anticipated Discharge Location: Barriers to Discharge: Dispo: Anticipated discharge in approximately 1-2 day(s).   Timothy Lasso, MD 07/10/2021, 8:06 AM Pager: [phone] After 5pm on weekdays and 1pm on weekends: On Call pager [phone]

## 2021-07-10 NOTE — Transfer of Care (Signed)
Immediate Anesthesia Transfer of Care Note  Patient: JOSEPHUS HARRIGER  Procedure(s) Performed: COLONOSCOPY WITH PROPOFOL ESOPHAGOGASTRODUODENOSCOPY (EGD) WITH PROPOFOL HOT HEMOSTASIS (ARGON PLASMA COAGULATION/BICAP) BIOPSY POLYPECTOMY  Patient Location: Endoscopy Unit  Anesthesia Type:MAC  Level of Consciousness: awake and drowsy  Airway & Oxygen Therapy: Patient Spontanous Breathing  Post-op Assessment: Report given to RN and Post -op Vital signs reviewed and stable  Post vital signs: Reviewed and stable  Last Vitals:  Vitals Value Taken Time  BP 154/72 07/10/21 1507  Temp 36.7 C 07/10/21 1506  Pulse 82 07/10/21 1509  Resp 15 07/10/21 1509  SpO2 94 % 07/10/21 1509  Vitals shown include unvalidated device data.  Last Pain:  Vitals:   07/10/21 1506  TempSrc: Temporal  PainSc: 0-No pain      Patients Stated Pain Goal: 0 (79/15/05 6979)  Complications: No notable events documented.

## 2021-07-10 NOTE — Anesthesia Procedure Notes (Signed)
Procedure Name: MAC Date/Time: 07/10/2021 1:57 PM Performed by: Dorann Lodge, CRNA Pre-anesthesia Checklist: Patient identified, Emergency Drugs available, Suction available, Patient being monitored and Timeout performed Patient Re-evaluated:Patient Re-evaluated prior to induction Oxygen Delivery Method: Nasal cannula

## 2021-07-10 NOTE — Op Note (Signed)
[facility] Patient Name: Alexander [name] Procedure Date : 07/10/2021 MRN: [id number] Attending MD: Carlota Raspberry. Havery Moros , MD Date of Birth: Sep 18, 1950 CSN: [id number] Age: 71 Admit Type: Outpatient Procedure:                Colonoscopy Indications:              Iron deficiency anemia Providers:                Remo Lipps P. Havery Moros, MD, Jeanella Cara,                            RN, Luan Moore, Technician Referring MD:              Medicines:                Monitored Anesthesia Care Complications:            No immediate complications. Estimated blood loss:                            Minimal. Estimated Blood Loss:     Estimated blood loss was minimal. Procedure:                Pre-Anesthesia Assessment:                           - Prior to the procedure, a History and Physical                            was performed, and patient medications and                            allergies were reviewed. The patient's tolerance of                            previous anesthesia was also reviewed. The risks                            and benefits of the procedure and the sedation                            options and risks were discussed with the patient.                            All questions were answered, and informed consent                            was obtained. Prior Anticoagulants: The patient has                            taken no previous anticoagulant or antiplatelet                            agents. ASA Grade Assessment: III - A patient with                            severe  systemic disease. After reviewing the risks                            and benefits, the patient was deemed in                            satisfactory condition to undergo the procedure.                           After obtaining informed consent, the colonoscope                            was passed under direct vision. Throughout the                            procedure, the patient's  blood pressure, pulse, and                            oxygen saturations were monitored continuously. The                            [id number] ([id number] Olympus colonoscope was                            introduced through the anus and advanced to the the                            terminal ileum, with identification of the                            appendiceal orifice and IC valve. The colonoscopy                            was performed without difficulty. The patient                            tolerated the procedure well. The quality of the                            bowel preparation was adequate. The terminal ileum,                            ileocecal valve, appendiceal orifice, and rectum                            were photographed. Scope In: 2:24:02 PM Scope Out: 2:56:37 PM Scope Withdrawal Time: 0 hours 24 minutes 25 seconds  Total Procedure Duration: 0 hours 32 minutes 35 seconds  Findings:      The perianal and digital rectal examinations were normal.      The terminal ileum appeared normal.      A single medium-sized angiodysplastic lesion was found in the cecum.       Fulguration to ablate the lesion to prevent bleeding by argon plasma was       successful.  Two medium-sized angiodysplastic lesions were found in the ascending       colon. Fulguration to ablate the lesion to prevent bleeding by argon       plasma was successful.      Two sessile polyps were found in the ascending colon. The polyps were 3       mm in size. These polyps were removed with a cold snare. Resection and       retrieval were complete.      A single medium-sized angiodysplastic lesion was found at the hepatic       flexure. Fulguration to ablate the lesion to prevent bleeding by argon       plasma was successful.      Two medium-sized angiodysplastic lesions were found in the transverse       colon. Fulguration to ablate the lesion to prevent bleeding by argon       plasma was successful.       A 4 mm polyp was found in the rectum. The polyp was sessile. The polyp       was removed with a cold snare. Resection and retrieval were complete.      The exam was otherwise without abnormality. Impression:               - The examined portion of the ileum was normal.                           - A single colonic angiodysplastic lesion. Treated                            with argon plasma coagulation (APC).                           - Two colonic angiodysplastic lesions. Treated with                            argon plasma coagulation (APC).                           - Two 3 mm polyps in the ascending colon, removed                            with a cold snare. Resected and retrieved.                           - A single colonic angiodysplastic lesion. Treated                            with argon plasma coagulation (APC).                           - Two colonic angiodysplastic lesions. Treated with                            argon plasma coagulation (APC).                           - One 4 mm polyp in the rectum, removed with a  cold                            snare. Resected and retrieved.                           - The examination was otherwise normal.                           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 Recommendation:           - Patient has a contact number available for                            emergencies. The signs and symptoms of potential                            delayed complications were discussed with the                            patient. Return to normal activities tomorrow.                            Written discharge instructions were provided to the                            patient.                           - Diet per EGD note                           - Continue present medications.                           - Await pathology results.                           - Full  recommendations per EGD note Procedure Code(s):        --- Professional ---                           [phone], 59, Colonoscopy, flexible; with control of                            bleeding, any method                           45385, Colonoscopy, flexible; with removal of                            tumor(s), polyp(s), or other lesion(s) by snare  technique Diagnosis Code(s):        --- Professional ---                           K55.20, Angiodysplasia of colon without hemorrhage                           K63.5, Polyp of colon                           K62.1, Rectal polyp                           D50.9, Iron deficiency anemia, unspecified CPT copyright 2019 American Medical Association. All rights reserved. The codes documented in this report are preliminary and upon coder review may  be revised to meet current compliance requirements. Remo Lipps P. Kyrielle Urbanski, MD 07/10/2021 3:10:37 PM This report has been signed electronically. Number of Addenda: 0

## 2021-07-10 NOTE — Anesthesia Preprocedure Evaluation (Addendum)
Anesthesia Evaluation  Patient identified by MRN, date of birth, ID band Patient awake    Reviewed: Allergy & Precautions, NPO status , Patient's Chart, lab work & pertinent test results  Airway Mallampati: II  TM Distance: >3 FB     Dental   Pulmonary shortness of breath, sleep apnea , former smoker,    breath sounds clear to auscultation       Cardiovascular hypertension,  Rhythm:Regular Rate:Normal     Neuro/Psych CVA    GI/Hepatic Neg liver ROS, GERD  ,  Endo/Other  diabetes  Renal/GU      Musculoskeletal  (+) Arthritis ,   Abdominal   Peds  Hematology   Anesthesia Other Findings   Reproductive/Obstetrics                             Anesthesia Physical Anesthesia Plan  ASA: 3  Anesthesia Plan:    Post-op Pain Management:    Induction: Intravenous  PONV Risk Score and Plan: Propofol infusion and Treatment may vary due to age or medical condition  Airway Management Planned: Simple Face Mask  Additional Equipment:   Intra-op Plan:   Post-operative Plan:   Informed Consent: I have reviewed the patients History and Physical, chart, labs and discussed the procedure including the risks, benefits and alternatives for the proposed anesthesia with the patient or authorized representative who has indicated his/her understanding and acceptance.     Dental advisory given  Plan Discussed with: Anesthesiologist and CRNA  Anesthesia Plan Comments:        Anesthesia Quick Evaluation

## 2021-07-10 NOTE — Progress Notes (Addendum)
Patient unable to wear CPAP tonight per RN, due to drinking prep for colonoscopy and the need to go back and forth to the bathroom. New CPAP placed in patients room for the following night.

## 2021-07-11 ENCOUNTER — Inpatient Hospital Stay (HOSPITAL_COMMUNITY): Payer: Medicare Other

## 2021-07-11 DIAGNOSIS — D696 Thrombocytopenia, unspecified: Secondary | ICD-10-CM | POA: Diagnosis not present

## 2021-07-11 DIAGNOSIS — K31819 Angiodysplasia of stomach and duodenum without bleeding: Secondary | ICD-10-CM

## 2021-07-11 DIAGNOSIS — D649 Anemia, unspecified: Secondary | ICD-10-CM | POA: Diagnosis not present

## 2021-07-11 DIAGNOSIS — K5520 Angiodysplasia of colon without hemorrhage: Secondary | ICD-10-CM | POA: Diagnosis not present

## 2021-07-11 LAB — CBC WITH DIFFERENTIAL/PLATELET
Abs Immature Granulocytes: 0.04 10*3/uL (ref 0.00–0.07)
Basophils Absolute: 0 10*3/uL (ref 0.0–0.1)
Basophils Relative: 1 %
Eosinophils Absolute: 0.1 10*3/uL (ref 0.0–0.5)
Eosinophils Relative: 2 %
HCT: 25.6 % — ABNORMAL LOW (ref 39.0–52.0)
Hemoglobin: 7.7 g/dL — ABNORMAL LOW (ref 13.0–17.0)
Immature Granulocytes: 1 %
Lymphocytes Relative: 20 %
Lymphs Abs: 1.1 10*3/uL (ref 0.7–4.0)
MCH: 22.3 pg — ABNORMAL LOW (ref 26.0–34.0)
MCHC: 30.1 g/dL (ref 30.0–36.0)
MCV: 74.2 fL — ABNORMAL LOW (ref 80.0–100.0)
Monocytes Absolute: 0.5 10*3/uL (ref 0.1–1.0)
Monocytes Relative: 10 %
Neutro Abs: 3.6 10*3/uL (ref 1.7–7.7)
Neutrophils Relative %: 66 %
Platelets: 104 10*3/uL — ABNORMAL LOW (ref 150–400)
RBC: 3.45 MIL/uL — ABNORMAL LOW (ref 4.22–5.81)
RDW: 18.7 % — ABNORMAL HIGH (ref 11.5–15.5)
WBC: 5.4 10*3/uL (ref 4.0–10.5)
nRBC: 0 % (ref 0.0–0.2)

## 2021-07-11 MED ORDER — PANTOPRAZOLE SODIUM 40 MG PO TBEC
40.0000 mg | DELAYED_RELEASE_TABLET | Freq: Two times a day (BID) | ORAL | Status: DC
  Administered 2021-07-11: 40 mg via ORAL
  Filled 2021-07-11: qty 1

## 2021-07-11 MED ORDER — PANTOPRAZOLE SODIUM 40 MG PO TBEC: 40.0000 mg | DELAYED_RELEASE_TABLET | Freq: Two times a day (BID) | ORAL | 0 refills | Status: DC

## 2021-07-11 MED ORDER — FERROUS SULFATE 325 (65 FE) MG PO TABS: 325.0000 mg | ORAL_TABLET | Freq: Every day | ORAL | 0 refills | Status: DC

## 2021-07-11 NOTE — Progress Notes (Signed)
° ° ° °   Progress Note   Subjective  Patient states he is feeling well, denies any bleeding or pain. Wants to eat.   Objective   Vital signs in last 24 hours: Temp:  [97.7 F (36.5 C)-98.5 F (36.9 C)] 98.4 F (36.9 C) (02/18 0831) Pulse Rate:  [71-84] 83 (02/18 0831) Resp:  [15-19] 19 (02/18 0831) BP: (146-174)/(65-77) 167/70 (02/18 0831) SpO2:  [95 %-98 %] 98 % (02/18 0831) Weight:  [114.8 kg] 114.8 kg (02/17 1300) Last BM Date : 07/10/21 General:    white male in NAD Neurologic:  Alert and oriented,  grossly normal neurologically. Psych:  Cooperative. Normal mood and affect.  Intake/Output from previous day: 02/17 0701 - 02/18 0700 In: 1240 [P.O.:720; I.V.:250; IV Piggyback:270] Out: 5 [Blood:5] Intake/Output this shift: No intake/output data recorded.  Lab Results: Recent Labs    07/09/21 1300 07/10/21 0347 07/11/21 0230  WBC 4.8 5.6 5.4  HGB 7.5* 7.6* 7.7*  HCT 25.8* 25.6* 25.6*  PLT 100* 104* 104*   BMET Recent Labs    07/08/21 1655 07/09/21 1300 07/10/21 0347  NA 138 138 139  K 3.6 3.8 3.6  CL 104 106 106  CO2 21* 24 25  GLUCOSE 114* 134* 112*  BUN 12 12 10   CREATININE 1.02 0.92 1.01  CALCIUM 9.3 8.9 8.9   LFT Recent Labs    07/09/21 1300  PROT 6.7  ALBUMIN 3.3*  AST 27  ALT 19  ALKPHOS 57  BILITOT 0.7   PT/INR Recent Labs    07/09/21 1331  LABPROT 15.2  INR 1.2    Studies/Results: No results found.     Assessment / Plan:    71 y/o male who was admitted with symptomatic iron deficiency anemia. On aspirin 81mg  / day and rare NSAID use. 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 I treated this with APC with good result. We discussed what this is. He does have chronic thrombocytopenia, and should  have a liver US to ensure no evidence of underlying cirrhosis. His LAEs have been okay.  He has received IV iron while in house and hopefully this will really help his Hgb moving forward. He should be taking oral iron moving forward and may benefit from a few additional doses of IV iron pending his course. Okay to advance his diety today and okay for discharge today. Our office will coordinate follow up with Dr. Bryan Lemma. Hopefully iron supplementation is all that he needs moving forward to keep his Hgb up. I also recommend protonix for the next 30 days or so to help with mucosal healing following APC treatment.   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 Bryan Lemma, our office to coordinate - okay to continue baby aspirin if needed. Avoid all other NSAIDs  Call with questions.  Jolly Mango, MD [facility]

## 2021-07-11 NOTE — Plan of Care (Signed)
°  Problem: Education: Goal: Knowledge of General Education information will improve Description: Including pain rating scale, medication(s)/side effects and non-pharmacologic comfort measures Outcome: Progressing   Problem: Health Behavior/Discharge Planning: Goal: Ability to manage health-related needs will improve Outcome: Progressing   Problem: Clinical Measurements: Goal: Ability to maintain clinical measurements within normal limits will improve Outcome: Progressing Goal: Will remain free from infection Outcome: Progressing Goal: Diagnostic test results will improve Outcome: Progressing Goal: Respiratory complications will improve Outcome: Progressing Goal: Cardiovascular complication will be avoided Outcome: Progressing   Problem: Activity: Goal: Risk for activity intolerance will decrease Outcome: Progressing   Problem: Nutrition: Goal: Adequate nutrition will be maintained Outcome: Progressing   Problem: Elimination: Goal: Will not experience complications related to bowel motility Outcome: Progressing Goal: Will not experience complications related to urinary retention Outcome: Progressing   Problem: Elimination: Goal: Will not experience complications related to urinary retention Outcome: Progressing   Problem: Pain Managment: Goal: General experience of comfort will improve Outcome: Progressing   Problem: Safety: Goal: Ability to remain free from injury will improve Outcome: Progressing   Problem: Skin Integrity: Goal: Risk for impaired skin integrity will decrease Outcome: Progressing

## 2021-07-11 NOTE — Discharge Summary (Addendum)
Name: Alexander [name] MRN: [id number] DOB: September 04, 1950 71 y.o. 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 Thrombocytopenia Type 2 DM Non obstructive CAD  Discharge Medications: Allergies as of 07/11/2021       Reactions   Contrast Media [iodinated Contrast Media] Other (See Comments)   STROKE   Gadolinium Other (See Comments)    Code: VOM, Desc: unusual reaction, patient was nauseous, lost consciousness, Onset Date: [id number]        Medication List     STOP taking these medications    ibuprofen 200 MG tablet Commonly known as: ADVIL   omeprazole 20 MG capsule Commonly known as: PRILOSEC       TAKE these medications    acetaminophen 500 MG tablet Commonly known as: TYLENOL Take 1,000 mg by mouth every 6 (six) hours as needed for mild pain.   albuterol 108 (90 Base) MCG/ACT inhaler Commonly known as: VENTOLIN HFA Inhale 2 puffs into the lungs every 6 (six) hours as needed for wheezing or shortness of breath.   allopurinol 300 MG tablet Commonly known as: ZYLOPRIM Take 300 mg by mouth at bedtime.   aspirin EC 81 MG tablet Take 81 mg by mouth every evening.   ferrous sulfate 325 (65 FE) MG tablet Take 1 tablet (325 mg total) by mouth daily.   fexofenadine 180 MG tablet Commonly known as: ALLEGRA Take 180 mg by mouth daily as needed (allergies).   gabapentin 100 MG capsule Commonly known as: NEURONTIN Take 100 mg by mouth daily as needed (nerve pain).   levofloxacin 500 MG tablet Commonly known as: LEVAQUIN Take 500 mg by mouth daily. For 10 days   metFORMIN 500 MG 24 hr tablet Commonly known as: GLUCOPHAGE-XR Take 500 mg by mouth 2 (two) times daily.   metoprolol succinate 50 MG 24 hr tablet Commonly known as: TOPROL-XL Take 50 mg by mouth  at bedtime.   pantoprazole 40 MG tablet Commonly known as: PROTONIX Take 1 tablet (40 mg total) by mouth 2 (two) times daily.   rosuvastatin 5 MG tablet Commonly known as: CRESTOR Take 1 tablet (5 mg total) by mouth 3 (three) times a week. What changed: when to take this        Disposition and follow-up:   Alexander [name] was discharged from [facility] in Stable condition.  At the hospital follow up visit please address:  1.  Please follow-up in the internal medicine clinic in 1 week-repeat CBC to trend hemoglobin levels and any signs active bleed bleeding; if hemoglobin levels have not improved at time of follow-up, discontinue aspirin and expedite GI follow-up appointment.  Follow-up with a gastroenterologist-follow-up biopsy for H. pylori  2.  Labs / imaging needed at time of follow-up: CBC- Hemoglobin  3.  Pending labs/ test needing follow-up: EGD/biopsy for H. pylori  Follow-up Appointments:  Follow-up Information     [place]. Schedule an appointment as soon as possible for a visit in 1 week(s).   Contact information: 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 He did endorse heavy NSAID use and takes aspirin 81 mg daily. On admission patient was found to have iron deficiency anemia. Iron studies reveal  iron level 16; iron saturation 4; ferritin 4. Hemoglobin of ~7.5. No active signs of bleeding during admission. Patient denied hematochezia, hematemesis or hematuria.  GI was consulted and performed EGD and colonoscopy.  Results revealed nonbleeding AVMs which were ablated to  prevent bleeding.  No ulcers or active bleeding was noted.  Biopsy was obtained for H. pylori testing.  Throughout hospitalization, patient's hemoglobin remained stable.  Patient's hemoglobin remained greater than 7, no transfusion needed.  Patient tolerated procedure well.  Denied any complaints and was stable for discharge.  All other chronic conditions were managed.  Type 2 DM Metformin held inpatient, no other issues  Non obstructive CAD Aspirin initially held but resumed after deemed stable by GI.  Discharge Exam:   BP (!) 167/70    Pulse 83    Temp 98.4 F (36.9 C) (Oral)    Resp 19    Ht 5\' 11"  (1.803 m)    Wt 114.8 kg    SpO2 98%    BMI 35.30 kg/m  Discharge exam: Physical Exam Constitutional:      General: He is awake. He is not in acute distress. Cardiovascular:     Rate and Rhythm: Normal rate and regular rhythm.     Heart sounds: Normal heart sounds.  Pulmonary:     Effort: Pulmonary effort is normal.     Breath sounds: Normal breath sounds and air entry.  Abdominal:     General: Bowel sounds are normal.     Palpations: Abdomen is soft.  Skin:    General: Skin is warm and dry.  Neurological:     General: No focal deficit present.     Mental Status: He is alert.  Psychiatric:        Behavior: Behavior normal. Behavior is cooperative.    Pertinent Labs, Studies, and Procedures:  CBC Latest Ref Rng & Units 07/11/2021 07/10/2021 07/09/2021  WBC 4.0 - 10.5 K/uL 5.4 5.6 4.8  Hemoglobin 13.0 - 17.0 g/dL 7.7(L) 7.6(L) 7.5(L)  Hematocrit 39.0 - 52.0 % 25.6(L) 25.6(L) 25.8(L)  Platelets 150 - 400 K/uL 104(L) 104(L) 100(L)   BMP Latest Ref Rng & Units 07/10/2021 07/09/2021 07/08/2021  Glucose 70 - 99 mg/dL 112(H) 134(H) 114(H)  BUN 8 - 23 mg/dL 10 12 12   Creatinine 0.61 - 1.24 mg/dL 1.01 0.92 1.02  BUN/Creat Ratio 10 - 24 - - -  Sodium 135 - 145 mmol/L 139 138 138  Potassium 3.5 - 5.1 mmol/L 3.6 3.8 3.6  Chloride 98 - 111 mmol/L 106 106 104  CO2 22 - 32 mmol/L 25 24 21(L)   Calcium 8.9 - 10.3 mg/dL 8.9 8.9 9.3   DG Chest 2 View  Result Date: 07/08/2021 CLINICAL DATA:  Chest pain, dyspnea on exertion EXAM: CHEST - 2 VIEW COMPARISON:  07/07/2021 FINDINGS: Heart size upper normal. Vascularity normal. Negative for heart failure or edema. Lungs are clear without infiltrate or effusion. IMPRESSION: No active cardiopulmonary disease. Electronically Signed   By: Franchot Gallo M.D.   On: 07/08/2021 18:30     Discharge Instructions: Discharge Instructions     Call MD for:  difficulty breathing, headache or visual disturbances   Complete by: As directed    Call MD for:  extreme fatigue  Complete by: As directed    Call MD for:  hives   Complete by: As directed    Call MD for:  persistant dizziness or light-headedness   Complete by: As directed    Call MD for:  persistant nausea and vomiting   Complete by: As directed    Call MD for:  redness, tenderness, or signs of infection (pain, swelling, redness, odor or green/yellow discharge around incision site)   Complete by: As directed    Call MD for:  severe uncontrolled pain   Complete by: As directed    Call MD for:  temperature >100.4   Complete by: As directed    Diet - low sodium heart healthy   Complete by: As directed    Increase activity slowly   Complete by: As directed        Signed: Timothy Lasso, MD 07/11/2021, 11:19 AM   Pager: [phone]

## 2021-07-11 NOTE — Progress Notes (Signed)
DISCHARGE NOTE HOME Alexander [name] to be discharged Home per MD order. Discussed prescriptions and follow up appointments with the patient. Prescriptions given to patient; medication list explained in detail. Patient verbalized understanding.  Skin clean, dry and intact without evidence of skin break down, no evidence of skin tears noted. IV catheter discontinued intact. Site without signs and symptoms of complications. Dressing and pressure applied. Pt denies pain at the site currently. No complaints noted.  Patient free of lines, drains, and wounds.   An After Visit Summary (AVS) was printed and given to the patient. Patient escorted via wheelchair, and discharged home via private auto.  [place], Zenon Mayo, RN

## 2021-07-11 NOTE — Discharge Instructions (Addendum)
Please avoid taking NSAIDs such as ibuprofen, Aleve, Advil or anything similar.  These medications are not good for your stomach.  Please take Protonix 40 mg twice a day and take your iron supplements daily.  Please follow-up at internal medicine clinic here at the hospital in 1 week.  Please follow-up with a gastroenterologist (stomach doctor) in a few weeks.  For pain, please take Tylenol.

## 2021-07-12 ENCOUNTER — Encounter (HOSPITAL_COMMUNITY): Payer: Self-pay | Admitting: Gastroenterology

## 2021-07-14 ENCOUNTER — Telehealth: Payer: Self-pay

## 2021-07-14 DIAGNOSIS — K31819 Angiodysplasia of stomach and duodenum without bleeding: Secondary | ICD-10-CM

## 2021-07-14 DIAGNOSIS — D696 Thrombocytopenia, unspecified: Secondary | ICD-10-CM

## 2021-07-14 DIAGNOSIS — D649 Anemia, unspecified: Secondary | ICD-10-CM

## 2021-07-14 LAB — SURGICAL PATHOLOGY

## 2021-07-14 NOTE — Telephone Encounter (Signed)
-----   Message from Yetta Flock, MD sent at 07/11/2021 10:21 AM EST ----- Regarding: cirigiliano patient [facility], This is a Cirigliano patient - needs a follow up CBC sometime later next week coordinated and outpatient follow up in a few weeks with VC or APP and you can help with that. Thanks  Dr. Loni Muse

## 2021-07-14 NOTE — Telephone Encounter (Signed)
Called and spoke with patient and his wife regarding recommendations. Pt knows to come to the [place] office later this week for labs. They are aware that no appt is necessary and aware of lab hours. Pt has been scheduled for a hospital follow up with Carl Best, NP on Wednesday, 08/05/21 at 2:30 pm. Pt is aware that I will mail him a copy of his appt information. Pt verbalized understanding of all information and had no concerns at the end of the call.   Lab order in epic. Letter mailed to patient with appt information.

## 2021-07-15 ENCOUNTER — Other Ambulatory Visit (INDEPENDENT_AMBULATORY_CARE_PROVIDER_SITE_OTHER): Payer: Medicare Other

## 2021-07-15 DIAGNOSIS — D649 Anemia, unspecified: Secondary | ICD-10-CM | POA: Diagnosis not present

## 2021-07-15 DIAGNOSIS — D696 Thrombocytopenia, unspecified: Secondary | ICD-10-CM

## 2021-07-15 DIAGNOSIS — K31819 Angiodysplasia of stomach and duodenum without bleeding: Secondary | ICD-10-CM

## 2021-07-15 LAB — CBC WITH DIFFERENTIAL/PLATELET
Basophils Absolute: 0.1 10*3/uL (ref 0.0–0.1)
Basophils Relative: 0.9 % (ref 0.0–3.0)
Eosinophils Absolute: 0.1 10*3/uL (ref 0.0–0.7)
Eosinophils Relative: 2.4 % (ref 0.0–5.0)
HCT: 27.9 % — ABNORMAL LOW (ref 39.0–52.0)
Hemoglobin: 8.7 g/dL — ABNORMAL LOW (ref 13.0–17.0)
Lymphocytes Relative: 20.1 % (ref 12.0–46.0)
Lymphs Abs: 1.1 10*3/uL (ref 0.7–4.0)
MCHC: 31.1 g/dL (ref 30.0–36.0)
MCV: 73.3 fl — ABNORMAL LOW (ref 78.0–100.0)
Monocytes Absolute: 0.4 10*3/uL (ref 0.1–1.0)
Monocytes Relative: 7.6 % (ref 3.0–12.0)
Neutro Abs: 3.8 10*3/uL (ref 1.4–7.7)
Neutrophils Relative %: 69 % (ref 43.0–77.0)
Platelets: 104 10*3/uL — ABNORMAL LOW (ref 150.0–400.0)
RBC: 3.81 Mil/uL — ABNORMAL LOW (ref 4.22–5.81)
RDW: 19.4 % — ABNORMAL HIGH (ref 11.5–15.5)
WBC: 5.5 10*3/uL (ref 4.0–10.5)

## 2021-07-16 ENCOUNTER — Telehealth: Payer: Self-pay | Admitting: General Surgery

## 2021-07-16 DIAGNOSIS — D649 Anemia, unspecified: Secondary | ICD-10-CM

## 2021-07-16 DIAGNOSIS — K31819 Angiodysplasia of stomach and duodenum without bleeding: Secondary | ICD-10-CM

## 2021-07-16 DIAGNOSIS — K5520 Angiodysplasia of colon without hemorrhage: Secondary | ICD-10-CM

## 2021-07-16 NOTE — Telephone Encounter (Signed)
-----   Message from [place], DO sent at 07/15/2021  4:13 PM EST ----- Hemoglobin slightly improved, now 8.7.  It was 7.7 at the time of hospital discharge.  Plan for follow-up in the GI clinic in approximately 4 weeks.  Continue oral iron.  Repeat CBC check in 4 weeks and repeat iron panel in 2 months.

## 2021-07-16 NOTE — Telephone Encounter (Signed)
Contacted the patient on his cell phone and he was driving, explained that his hgb is slightly improved. That we would like to see him back in the clinic in 4 weeks and have another cbc in 4 weeks and ab iron panel in 2 months. He is to also to continue oral iron, the patient verbalized understanding and will call back to schedule his appointments when he is not driving. Orders placed for the labs in [place]

## 2021-07-16 NOTE — Telephone Encounter (Signed)
-----   Message from [place], DO sent at 07/15/2021  4:13 PM EST ----- Hemoglobin slightly improved, now 8.7.  It was 7.7 at the time of hospital discharge.  Plan for follow-up in the GI clinic in approximately 4 weeks.  Continue oral iron.  Repeat CBC check in 4 weeks and repeat iron panel in 2 months.

## 2021-08-05 ENCOUNTER — Other Ambulatory Visit (INDEPENDENT_AMBULATORY_CARE_PROVIDER_SITE_OTHER): Payer: Medicare Other

## 2021-08-05 ENCOUNTER — Ambulatory Visit (INDEPENDENT_AMBULATORY_CARE_PROVIDER_SITE_OTHER): Payer: Medicare Other | Admitting: Nurse Practitioner

## 2021-08-05 ENCOUNTER — Encounter: Payer: Self-pay | Admitting: Nurse Practitioner

## 2021-08-05 VITALS — BP 152/78 | HR 82 | Ht 71.0 in | Wt 240.6 lb

## 2021-08-05 DIAGNOSIS — K621 Rectal polyp: Secondary | ICD-10-CM

## 2021-08-05 DIAGNOSIS — D500 Iron deficiency anemia secondary to blood loss (chronic): Secondary | ICD-10-CM

## 2021-08-05 LAB — CBC
HCT: 36.5 % — ABNORMAL LOW (ref 39.0–52.0)
Hemoglobin: 11.6 g/dL — ABNORMAL LOW (ref 13.0–17.0)
MCHC: 31.9 g/dL (ref 30.0–36.0)
MCV: 81.6 fl (ref 78.0–100.0)
Platelets: 78 10*3/uL — ABNORMAL LOW (ref 150.0–400.0)
RBC: 4.47 Mil/uL (ref 4.22–5.81)
RDW: 30.8 % — ABNORMAL HIGH (ref 11.5–15.5)
WBC: 5.4 10*3/uL (ref 4.0–10.5)

## 2021-08-05 NOTE — Progress Notes (Signed)
? ? ? ?08/05/2021 ?Alexander [name] ?[id number] ?[birth year] ? ? ?Chief Complaint: Anemia, hospital follow-up ? ?History of Present Illness: Alexander [name] is a 71 year old male with a past medical history significant for hypertension, hyperlipidemia, mild nonobstructive CAD with normal LV function.  CVA 2011, sleep apnea intolerant to CPAP, diabetes mellitus type 2, kidney stones, GERD, thrombocytopenia and anemia. ? ?He was admitted to the hospital 07/08/2021 with exertional dyspnea x 3 to 4 months and dark stools x 2 months.  He also noted having left flank pain.  Labs in the ED showed a hemoglobin level 8.0 -> 7.5 (Hg 10.4 on 07/21/2020).  He received IV Feraheme then Ferrlecit x 3 and was placed on PPI IV.  He underwent an EGD and colonoscopy 07/10/2021.  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  Gastric biopsies were negative for H. pylori.  The colonoscopy identified several colonic AVMs treated with APC and two 3 mm tubular adenomatous polyps which were removed from the ascending colon and one 4 mm tubular adenomatous polyp was removed from the rectum.  Rectal biopsies showed evidence of solitary rectal ulcer.  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  He was prescribed Pantoprazole 40 mg twice daily twice daily and Carafate 1 g every 8 hours.  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  An abdominal sonogram 07/11/2021 showed a diffusely coarsened echotexture thought to be related to a fatty disposition, splenomegaly and the portal vein was patent.  He was discharged home 07/11/2021 on Ferrous Sulfate 325 mg daily with instructions to to repeat a CBC in 1 week and to follow-up with Dr. Bryan [name]. ? ?He presents to our office today for posthospital discharge  follow-up.  He is accompanied by his son.  He feels his energy level has improved.  He is passing a normal formed brown bowel movement daily.  No further dark stools.  No rectal bleeding.  No chest pain or shortness of breath.  He remains on Pantoprazole 40 mg daily and Ferrous Sulfate 325 mg daily.  No complaints at this time. ? ? ?Past Medical History:  ?Diagnosis Date  ? Chest pain   ? a. 10.2006 Cath: nl cors, EF 65%;  b. 12/2008 Negative myoview.  ? DDD (degenerative disc disease), cervical   ? a. 08/2008 s/p R L4-5 diskectomy, foaminotomy, and lysis of adhesions.  ? Diabetes ([place])   ? GERD (gastroesophageal reflux disease)   ? Gout   ? History of kidney stones   ? Hyperlipidemia   ? Hypertension   ? OSA (obstructive sleep apnea)   ? a. on cpap  NOT ABLE TO USE  ? Palpitations   ? a. 11/2008 - nl holter monitor;  b. 04/2010 Echo: EF 50-55%, Gr 1 DD.  ? Skin cancer   ? Stroke [facility]   ? a. 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  ? ?EGD 07/10/2021:  ?- Esophagogastric landmarks identified. ?- Normal esophagus otherwise ?- A single non-bleeding angiodysplastic lesion in the stomach. Treated with argon plasma ?coagulation (APC). 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 Treated with argon plasma coagulation ?(APC). ?- Erythematous mucosa in the gastric fundus and gastric body. Biopsied. ?- Normal duodenal bulb and second portion of the duodenum. ? ?Colonoscopy 07/09/2021: ?The examined portion of the ileum  was normal. ?- A single colonic angiodysplastic lesion found in the cecum treated with argon plasma coagulation (APC). ?- Two colonic angiodysplastic lesions found in the ascending colon.  Treated with argon plasma coagulation (APC). ?- Two 3 mm polyps in the ascending colon, removed with a cold snare. Resected and ?retrieved. ?- A single colonic angiodysplastic lesion at the hepatic flexure.  Treated with argon plasma coagulation (APC). ?- Two colonic angiodysplastic lesions found at the transverse  colon treated with argon plasma coagulation (APC). ?- One 4 mm polyp in the rectum, removed with a cold snare. Resected and retrieved. ?- The examination was otherwise normal. ? ?FINAL MICROSCOPIC DIAGNOSIS:  ? ?A.   STOMACH, BIOPSY:  ?-     Mild chronic gastritis.  ?-     Mucosal edema with dilated blood vessels.  ?-     No Helicobacter pylori organisms identified.  ?-     No intestinal metaplasia, dysplasia or malignancy identified.  ? ?B.   COLON POLYPS, POLYPECTOMY:  ?-     Tubular adenoma.  ?-     Solitary rectal ulcer.  ?-     No high-grade dysplasia or malignancy.  ? ?Current Outpatient Medications on File Prior to Visit  ?Medication Sig Dispense Refill  ? acetaminophen (TYLENOL) 500 MG tablet Take 1,000 mg by mouth every 6 (six) hours as needed for mild pain.    ? albuterol (VENTOLIN HFA) 108 (90 Base) MCG/ACT inhaler Inhale 2 puffs into the lungs every 6 (six) hours as needed for wheezing or shortness of breath.    ? allopurinol (ZYLOPRIM) 300 MG tablet Take 300 mg by mouth at bedtime.    ? aspirin EC 81 MG tablet Take 81 mg by mouth every evening.    ? ferrous sulfate 325 (65 FE) MG tablet Take 1 tablet (325 mg total) by mouth daily. 90 tablet 0  ? fexofenadine (ALLEGRA) 180 MG tablet Take 180 mg by mouth daily as needed (allergies).    ? metFORMIN (GLUCOPHAGE-XR) 500 MG 24 hr tablet Take 500 mg by mouth 2 (two) times daily.    ? metoprolol succinate (TOPROL-XL) 50 MG 24 hr tablet Take 50 mg by mouth at bedtime.    ? pantoprazole (PROTONIX) 40 MG tablet Take 1 tablet (40 mg total) by mouth 2 (two) times daily. 60 tablet 0  ? rosuvastatin (CRESTOR) 5 MG tablet Take 1 tablet (5 mg total) by mouth 3 (three) times a week. (Patient taking differently: Take 5 mg by mouth at bedtime.) 15 tablet 3  ? gabapentin (NEURONTIN) 100 MG capsule Take 100 mg by mouth daily as needed (nerve pain). (Patient not taking: Reported on 08/05/2021)    ? ?No current facility-administered medications on file prior to visit.   ? ?Allergies  ?Allergen Reactions  ? Contrast Media [Iodinated Contrast Media] Other (See Comments)  ?  STROKE  ? Gadolinium Other (See Comments)  ?   Code: VOM, Desc: unusual reaction, patient was nauseous, lost consciousness, Onset Date: [id number] ?  ? ? ?Current Medications, Allergies, Past Medical History, Past Surgical History, Family History and Social History were reviewed in [employer] record. ? ?Review of Systems:   ?Constitutional: Negative for fever, sweats, chills or weight loss.  ?Respiratory: Negative for shortness of breath.   ?Cardiovascular: Negative for chest pain, palpitations and leg swelling.  ?Gastrointestinal: See HPI.  ?Musculoskeletal: Negative for back pain or muscle aches.  ?Neurological: Negative for dizziness, headaches or paresthesias.  ? ?Physical Exam: ?BP (!) 152/78  Pulse 82   Ht '5\' 11"'$  (1.803 m)   Wt 240 lb 9.6 oz (109.1 kg)   SpO2 97%   BMI 33.56 kg/m?  ?General: 71 year old male appears older than his stated age but in no acute distress. ?Head: Normocephalic and atraumatic. ?Eyes: No scleral icterus. Conjunctiva pink . ?Ears: Normal auditory acuity. ?Mouth: Dentition intact. No ulcers or lesions.  ?Lungs: Clear throughout to auscultation. ?Heart: Regular rate and rhythm, no murmur. ?Abdomen: Soft, nontender and nondistended. No masses or hepatomegaly. Normal bowel sounds x 4 quadrants.  ?Rectal: Deferred. ?Musculoskeletal: Symmetrical with no gross deformities. ?Extremities: No edema. ?Neurological: Alert oriented x 4. No focal deficits.  ?Psychological: Alert and cooperative. Normal mood and affect ? ?Assessment and Recommendations: ? ?40) 72 year old male admitted to the hospital 07/08/2021 with exertional dyspnea, dark stools with iron deficiency anemia.  He received IV iron but did not require blood transfusion.  EGD and colonoscopy  were completed 07/10/2021.  The EGD identified a single nonbleeding AVM in the stomach treated with APC and suspected  mild gastric antral vascular ectasia treated with APC.  No evidence of H. pylori.  The colonoscopy identified several colonic AVMs treated with APC and two 3 mm polyps which were removed from the ascending colon and on

## 2021-08-05 NOTE — Patient Instructions (Addendum)
RECOMMENDATIONS: ? ?Continue Ferrous Sulfate indefinitely. ?Please contact our office if you develop frequent loose black stools or bright red rectal bleeding. ? ?Please proceed to the basement level for lab work before leaving today. Press "B" on the elevator. The lab is located at the first door on the left as you exit the elevator. ? ?Thank you for trusting me with your gastrointestinal care!   ? ?Noralyn Pick, CRNP ? ? ? ?BMI: ? ?If you are age 72 or older, your body mass index should be between 23-30. Your Body mass index is 33.56 kg/m?Marland Kitchen If this is out of the aforementioned range listed, please consider follow up with your Primary Care Provider. ? ?If you are age 68 or younger, your body mass index should be between 19-25. Your Body mass index is 33.56 kg/m?Marland Kitchen If this is out of the aformentioned range listed, please consider follow up with your Primary Care Provider.  ? ?MY CHART: ? ?The  GI providers would like to encourage you to use [facility] to communicate with providers for non-urgent requests or questions.  Due to long hold times on the telephone, sending your provider a message by [facility] may be a faster and more efficient way to get a response.  Please allow 48 business hours for a response.  Please remember that this is for non-urgent requests.  ? ?

## 2021-08-06 ENCOUNTER — Telehealth: Payer: Self-pay

## 2021-08-06 ENCOUNTER — Other Ambulatory Visit: Payer: Self-pay

## 2021-08-06 LAB — IRON,TIBC AND FERRITIN PANEL
%SAT: 10 % (calc) — ABNORMAL LOW (ref 20–48)
Ferritin: 43 ng/mL (ref 24–380)
Iron: 36 ug/dL — ABNORMAL LOW (ref 50–180)
TIBC: 346 mcg/dL (calc) (ref 250–425)

## 2021-08-06 MED ORDER — FERROUS SULFATE 325 (65 FE) MG PO TABS: 325.0000 mg | ORAL_TABLET | Freq: Every day | ORAL | 3 refills | Status: DC

## 2021-08-06 NOTE — Telephone Encounter (Signed)
Called patient and spoke to him and his wife. Gave lab results. Sent refill to pharmacy for Ferrous Sulfate-625 mg QD. Ordered labs for 2 weeks out and patient agrees to come into our lab for those on or near 08/20/21. Scheduled office visit with Dr. Bryan Lemma on 09/07/21. ?

## 2021-08-06 NOTE — Progress Notes (Signed)
Agree with the assessment and plan as outlined by Carl Best, NP.  Based on ultrasound findings, I think it is reasonable to check fib 4 score first, and if indeterminate or elevated, proceed with elastography.  If normal, can hold off on additional elastography study.  Otherwise, agree with extended serologic work-up as outlined, and can follow-up with me in 4-6 weeks to review results and ongoing plan. ? ? ?Martino Tompson, DO, FACG ?[facility] ? ?

## 2021-08-14 ENCOUNTER — Ambulatory Visit: Payer: Medicare Other | Admitting: Gastroenterology

## 2021-08-14 ENCOUNTER — Telehealth: Payer: Self-pay

## 2021-08-14 NOTE — Telephone Encounter (Signed)
Called pt to clarify that office visit will be in [facility] on 09/07/21. Pt and pt's wife wife verbalized understanding. Pt and pt's wife aware they will come to [place] office on 3/30 for labs.  ?

## 2021-08-20 ENCOUNTER — Other Ambulatory Visit (INDEPENDENT_AMBULATORY_CARE_PROVIDER_SITE_OTHER): Payer: Medicare Other

## 2021-08-20 ENCOUNTER — Other Ambulatory Visit: Payer: Self-pay

## 2021-08-20 ENCOUNTER — Other Ambulatory Visit: Payer: Self-pay | Admitting: General Surgery

## 2021-08-20 DIAGNOSIS — K7460 Unspecified cirrhosis of liver: Secondary | ICD-10-CM

## 2021-08-20 DIAGNOSIS — K5520 Angiodysplasia of colon without hemorrhage: Secondary | ICD-10-CM

## 2021-08-20 DIAGNOSIS — K31819 Angiodysplasia of stomach and duodenum without bleeding: Secondary | ICD-10-CM

## 2021-08-20 DIAGNOSIS — D500 Iron deficiency anemia secondary to blood loss (chronic): Secondary | ICD-10-CM | POA: Diagnosis not present

## 2021-08-20 DIAGNOSIS — D649 Anemia, unspecified: Secondary | ICD-10-CM

## 2021-08-20 LAB — BASIC METABOLIC PANEL
BUN: 12 mg/dL (ref 6–23)
CO2: 25 mEq/L (ref 19–32)
Calcium: 9.6 mg/dL (ref 8.4–10.5)
Chloride: 104 mEq/L (ref 96–112)
Creatinine, Ser: 1 mg/dL (ref 0.40–1.50)
GFR: 75.6 mL/min (ref >60.00)
Glucose, Bld: 148 mg/dL — ABNORMAL HIGH (ref 70–99)
Potassium: 4 mEq/L (ref 3.5–5.1)
Sodium: 138 mEq/L (ref 135–145)

## 2021-08-20 LAB — CBC WITH DIFFERENTIAL/PLATELET
Basophils Absolute: 0 10*3/uL (ref 0.0–0.1)
Basophils Relative: 0.9 % (ref 0.0–3.0)
Eosinophils Absolute: 0.1 10*3/uL (ref 0.0–0.7)
Eosinophils Relative: 1.9 % (ref 0.0–5.0)
HCT: 38.6 % — ABNORMAL LOW (ref 39.0–52.0)
Hemoglobin: 12.7 g/dL — ABNORMAL LOW (ref 13.0–17.0)
Lymphocytes Relative: 20.6 % (ref 12.0–46.0)
Lymphs Abs: 1 10*3/uL (ref 0.7–4.0)
MCHC: 32.8 g/dL (ref 30.0–36.0)
MCV: 84.7 fl (ref 78.0–100.0)
Monocytes Absolute: 0.3 10*3/uL (ref 0.1–1.0)
Monocytes Relative: 7.2 % (ref 3.0–12.0)
Neutro Abs: 3.3 10*3/uL (ref 1.4–7.7)
Neutrophils Relative %: 69.4 % (ref 43.0–77.0)
Platelets: 81 10*3/uL — ABNORMAL LOW (ref 150.0–400.0)
RBC: 4.56 Mil/uL (ref 4.22–5.81)
RDW: 29.7 % — ABNORMAL HIGH (ref 11.5–15.5)
WBC: 4.7 10*3/uL (ref 4.0–10.5)

## 2021-08-20 LAB — HEPATIC FUNCTION PANEL
ALT: 37 U/L (ref 0–53)
AST: 48 U/L — ABNORMAL HIGH (ref 0–37)
Albumin: 4.3 g/dL (ref 3.5–5.2)
Alkaline Phosphatase: 61 U/L (ref 39–117)
Bilirubin, Direct: 0.2 mg/dL (ref 0.0–0.3)
Total Bilirubin: 0.7 mg/dL (ref 0.2–1.2)
Total Protein: 7.7 g/dL (ref 6.0–8.3)

## 2021-08-21 ENCOUNTER — Telehealth: Payer: Self-pay

## 2021-08-21 NOTE — Telephone Encounter (Signed)
Received call from [facility] stating that patient's platelets are 96. Results in epic.  ?

## 2021-08-21 NOTE — Telephone Encounter (Signed)
Spoke with pt. Gave pt results and recommendations. Pt verbalized understanding and had no concerns at end of call.  ?

## 2021-08-24 NOTE — Progress Notes (Signed)
? ?Cardiology Office Note   ? ?Date:  09/02/2021  ? ?ID:  [name], DOB [birth year], MRN [id number] ? ? ?PCP:  Monico Blitz, MD ?  ?[place]  ?Cardiologist:  Lauree Chandler, MD   ?Advanced Practice Provider:  No care team member to display ?Electrophysiologist:  None  ? ?[id number]}  ? ?Chief Complaint  ?Patient presents with  ? Follow-up  ? ? ?History of Present Illness:  ?Alexander [name] is a 71 y.o. male with history of diabetes mellitus, chest pain, gout, GERD, HTN, hyperlipidemia, sleep apnea on CPAP and prior stroke  ? ? He had a cardiac catheterization in 2006 with no evidence of CAD. Echo February 2014 with LVEF=55-60%, no valve disease.  He was seen in primary care and had a stress test arranged. Nuclear stress test at [facility] 07/18/20 with no evidence of ischemia. LVEF=65%. His former daughter-in-law works in our cath lab [employer]). Cardiac cath 07/25/20 with mild non-obstructive CAD (20% proximal RCA stenosis, 20% mid Circumflex stenosis). LV systolic function normal by LV gram. Normal LV filling pressures. Echo 08/08/20 with LVEF=60-65%, moderate basal septal hypertrophy. No significant valve disease. He was found to be anemic by pre-cath lab testing  ? ?Patient last saw Dr. Angelena Form 08/25/20 and doing well. Crestor added 3 times weekly. ? ?Patient comes in for f/u. Admitted with anemia 07/2021 nonbleeding AVM in stomach, vascular ectasia, Colon AVM's, tubular adenomatous polyps, abdominal US fatty disposition, splenomegaly-seeing liver specialist next week. Suspect cirrhosis secondary to fatty liver but normal LFT's. Denies chest pain, dyspnea, palpitations. Still works in his shop and runs a business. Was short of breath with anemia but now back to normal.  ? ?Past Medical History:  ?Diagnosis Date  ? Chest pain   ? a. 10.2006 Cath: nl cors, EF 65%;  b. 12/2008 Negative myoview.  ? DDD (degenerative disc disease), cervical   ? a. 08/2008 s/p R L4-5 diskectomy, foaminotomy,  and lysis of adhesions.  ? Diabetes ([place])   ? GERD (gastroesophageal reflux disease)   ? Gout   ? History of kidney stones   ? Hyperlipidemia   ? Hypertension   ? OSA (obstructive sleep apnea)   ? a. on cpap  NOT ABLE TO USE  ? Palpitations   ? a. 11/2008 - nl holter monitor;  b. 04/2010 Echo: EF 50-55%, Gr 1 DD.  ? Skin cancer   ? Stroke [facility])   ? a. 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  ? ? ?Past Surgical History:  ?Procedure Laterality Date  ? APPENDECTOMY    ? BACK SURGERY    ? BIOPSY  07/10/2021  ? Procedure: BIOPSY;  Surgeon: Yetta Flock, MD;  Location: [facility] ENDOSCOPY;  Service: Gastroenterology;;  ? CATARACT EXTRACTION    ? RIGHT  ? COLONOSCOPY WITH PROPOFOL N/A 07/10/2021  ? Procedure: COLONOSCOPY WITH PROPOFOL;  Surgeon: Yetta Flock, MD;  Location: [place];  Service: Gastroenterology;  Laterality: N/A;  ? ESOPHAGOGASTRODUODENOSCOPY (EGD) WITH PROPOFOL N/A 07/10/2021  ? Procedure: ESOPHAGOGASTRODUODENOSCOPY (EGD) WITH PROPOFOL;  Surgeon: Yetta Flock, MD;  Location: [place];  Service: Gastroenterology;  Laterality: N/A;  ? FOOT SURGERY    ? HEMORROIDECTOMY    ? HOT HEMOSTASIS N/A 07/10/2021  ? Procedure: HOT HEMOSTASIS (ARGON PLASMA COAGULATION/BICAP);  Surgeon: Yetta Flock, MD;  Location: [facility] ENDOSCOPY;  Service: Gastroenterology;  Laterality: N/A;  ? KNEE SURGERY    ? LEFT HEART CATHETERIZATION WITH CORONARY ANGIOGRAM N/A 07/18/2012  ? Procedure:  LEFT HEART CATHETERIZATION WITH CORONARY ANGIOGRAM;  Surgeon: Burnell Blanks, MD;  Location: [facility] CATH LAB;  Service: Cardiovascular;  Laterality: N/A;  ? LUMBAR LAMINECTOMY/DECOMPRESSION MICRODISCECTOMY Left 01/27/2016  ? Procedure: Left Lumbar Four-Five, Lumbar FIve-Sacral One Laminectomy/Foraminotomy;  Surgeon: Leeroy Cha, MD;  Location: [place] NEURO ORS;  Service: Neurosurgery;  Laterality: Left;  Left L4-5 L5-S1 Laminectomy/Foraminotomy  ? POLYPECTOMY  07/10/2021  ? Procedure: POLYPECTOMY;  Surgeon: Yetta Flock, MD;  Location: [place];  Service: Gastroenterology;;  ? RIGHT/LEFT HEART CATH AND CORONARY ANGIOGRAPHY N/A 07/25/2020  ? Procedure: RIGHT/LEFT HEART CATH AND CORONARY ANGIOGRAPHY;  Surgeon: Burnell Blanks, MD;  Location: [place] CV LAB;  Service: Cardiovascular;  Laterality: N/A;  ? TONSILLECTOMY    ? ? ?Current Medications: ?Current Meds  ?Medication Sig  ? acetaminophen (TYLENOL) 500 MG tablet Take 1,000 mg by mouth every 6 (six) hours as needed for mild pain.  ? allopurinol (ZYLOPRIM) 300 MG tablet Take 300 mg by mouth at bedtime.  ? aspirin EC 81 MG tablet Take 81 mg by mouth every evening.  ? ferrous sulfate 325 (65 FE) MG tablet Take 1 tablet (325 mg total) by mouth daily.  ? fexofenadine (ALLEGRA) 180 MG tablet Take 180 mg by mouth daily as needed (allergies).  ? metFORMIN (GLUCOPHAGE-XR) 500 MG 24 hr tablet Take 500 mg by mouth 2 (two) times daily.  ? metoprolol succinate (TOPROL-XL) 50 MG 24 hr tablet Take 50 mg by mouth at bedtime.  ?  ? ?Allergies:   Contrast media [iodinated contrast media] and Gadolinium  ? ?Social History  ? ?Socioeconomic History  ? Marital status: Married  ?  Spouse name: Not on file  ? Number of children: 1  ? Years of education: Not on file  ? Highest education level: Not on file  ?Occupational History  ? 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 Smoking status: Former  ?  Types: Cigarettes  ?  Quit date: 05/25/1983  ?  Years since quitting: 38.3  ? Smokeless tobacco: Never  ? Tobacco comments:  ?  quit smoking in 1986  ?Vaping Use  ? Vaping Use: Never used  ?Substance and Sexual Activity  ? Alcohol use: No  ?  Comment: former - quit driking in 1983.  ? Drug use: No  ? Sexual activity: Not on file  ?Other Topics Concern  ? Not on file  ?Social History Narrative  ? Lives in [place], [place] with spouse.  Works as a Administrator.  ? ?Social Determinants of Health  ? ?Financial Resource Strain: Not on file  ?Food Insecurity: Not on file  ?Transportation  Needs: Not on file  ?Physical Activity: Not on file  ?Stress: Not on file  ?Social Connections: Not on file  ?  ? ?Family History:  The patient's  family history includes Heart attack in his father; Stomach cancer in his mother.  ? ?ROS:   ?Please see the history of present illness.    ?ROS All other systems reviewed and are negative. ? ? ?PHYSICAL EXAM:   ?VS:  BP 120/64   Pulse 66   Ht '5\' 11"'$  (1.803 m)   Wt 244 lb 12.8 oz (111 kg)   SpO2 95%   BMI 34.14 kg/m?   ?Physical Exam  ?GEN: Obese, in no acute distress  ?Neck: no JVD, carotid bruits, or masses ?Cardiac:RRR; no murmurs, rubs, or gallops  ?Respiratory:  clear to auscultation bilaterally, normal work of breathing ?GI: soft, nontender,  nondistended, + BS ?Ext: without cyanosis, clubbing, or edema, Good distal pulses bilaterally ?Neuro:  Alert and Oriented x 3 ?Psych: euthymic mood, full affect ? ?Wt Readings from Last 3 Encounters:  ?09/02/21 244 lb 12.8 oz (111 kg)  ?08/05/21 240 lb 9.6 oz (109.1 kg)  ?07/10/21 253 lb 1.4 oz (114.8 kg)  ?  ? ? ?Studies/Labs Reviewed:  ? ?EKG:  EKG is not ordered today.    ?Recent Labs: ?07/08/2021: B Natriuretic Peptide 111.7; Magnesium 1.9 ?08/20/2021: ALT 40; BUN 12; Creatinine, Ser 1.00; Hemoglobin 12.7; Platelets 96; Potassium 4.0; Sodium 138  ? ?Lipid Panel ?   ?Component Value Date/Time  ? CHOL 80 (L) 08/20/2021 1005  ? TRIG 233 (H) 08/20/2021 1005  ? HDL 27 (L) 07/18/2012 0440  ? CHOLHDL 2.9 07/18/2012 0440  ? VLDL 27 07/18/2012 0440  ? [place] 25 07/18/2012 0440  ? ? ?Additional studies/ records that were reviewed today include:  ?Echo 08/08/20: ? 1. No mural thrombus with Definity. Left ventricular ejection fraction,  ?by estimation, is 60 to 65%. The left ventricle has normal function. The  ?left ventricle has no regional wall motion abnormalities. There is  ?moderate asymmetric left ventricular  ?hypertrophy of the basal-septal segment. Left ventricular diastolic  ?parameters were normal.  ? 2. Right ventricular  systolic function is normal. The right ventricular  ?size is normal.  ? 3. The mitral valve is grossly normal. Trivial mitral valve  ?regurgitation.  ? 4. The aortic valve is tricuspid. Aortic valve regurg

## 2021-08-25 ENCOUNTER — Other Ambulatory Visit: Payer: Self-pay

## 2021-08-25 DIAGNOSIS — R932 Abnormal findings on diagnostic imaging of liver and biliary tract: Secondary | ICD-10-CM

## 2021-08-25 DIAGNOSIS — D500 Iron deficiency anemia secondary to blood loss (chronic): Secondary | ICD-10-CM

## 2021-08-25 LAB — HEPATITIS A ANTIBODY, TOTAL: Hepatitis A AB,Total: REACTIVE — AB

## 2021-08-25 LAB — ANA: Anti Nuclear Antibody (ANA): NEGATIVE

## 2021-08-25 LAB — HEPATITIS B CORE ANTIBODY, TOTAL: Hep B Core Total Ab: NONREACTIVE

## 2021-08-25 LAB — HEPATITIS B SURFACE ANTIBODY, QUANTITATIVE: Hep B S AB Quant (Post): <5 m[IU]/mL — ABNORMAL LOW (ref >=10)

## 2021-08-25 LAB — MITOCHONDRIAL ANTIBODIES: Mitochondrial M2 Ab, IgG: <=20 U (ref <=20.0)

## 2021-08-25 LAB — HEPATITIS C ANTIBODY
Hepatitis C Ab: NONREACTIVE
SIGNAL TO CUT-OFF: 0.06 (ref <1.00)

## 2021-08-25 LAB — ANTI-SMOOTH MUSCLE ANTIBODY, IGG: Actin (Smooth Muscle) Antibody (IGG): <20 U (ref <20)

## 2021-08-25 LAB — HEPATITIS B SURFACE ANTIGEN: Hepatitis B Surface Ag: NONREACTIVE

## 2021-08-25 LAB — ALPHA-1-ANTITRYPSIN: A-1 Antitrypsin, Ser: 138 mg/dL (ref 83–199)

## 2021-08-31 ENCOUNTER — Encounter (HOSPITAL_COMMUNITY): Payer: Self-pay | Admitting: Gastroenterology

## 2021-09-02 ENCOUNTER — Encounter: Payer: Self-pay | Admitting: Physician Assistant

## 2021-09-02 ENCOUNTER — Ambulatory Visit (INDEPENDENT_AMBULATORY_CARE_PROVIDER_SITE_OTHER): Payer: Medicare Other | Admitting: Physician Assistant

## 2021-09-02 VITALS — BP 120/64 | HR 66 | Ht 71.0 in | Wt 244.8 lb

## 2021-09-02 DIAGNOSIS — I2510 Atherosclerotic heart disease of native coronary artery without angina pectoris: Secondary | ICD-10-CM

## 2021-09-02 DIAGNOSIS — E119 Type 2 diabetes mellitus without complications: Secondary | ICD-10-CM

## 2021-09-02 DIAGNOSIS — G4733 Obstructive sleep apnea (adult) (pediatric): Secondary | ICD-10-CM

## 2021-09-02 DIAGNOSIS — R079 Chest pain, unspecified: Secondary | ICD-10-CM

## 2021-09-02 DIAGNOSIS — Z8673 Personal history of transient ischemic attack (TIA), and cerebral infarction without residual deficits: Secondary | ICD-10-CM

## 2021-09-02 DIAGNOSIS — I10 Essential (primary) hypertension: Secondary | ICD-10-CM | POA: Diagnosis not present

## 2021-09-02 DIAGNOSIS — E785 Hyperlipidemia, unspecified: Secondary | ICD-10-CM

## 2021-09-02 NOTE — Patient Instructions (Signed)
Medication Instructions:  ?Your physician recommends that you continue on your current medications as directed. Please refer to the Current Medication list given to you today. ? ?*If you need a refill on your cardiac medications before your next appointment, please call your pharmacy* ? ? ?Lab Work: ?None ordered ? ?If you have labs (blood work) drawn today and your tests are completely normal, you will receive your results only by: ?MyChart Message (if you have MyChart) OR ?A paper copy in the mail ?If you have any lab test that is abnormal or we need to change your treatment, we will call you to review the results. ? ? ?Testing/Procedures: ?None ordered ? ?You have been referred to [facility] ? ?Follow-Up: ?At [facility], you and your health needs are our priority.  As part of our continuing mission to provide you with exceptional heart care, we have created designated Provider Care Teams.  These Care Teams include your primary Cardiologist (physician) and Advanced Practice Providers (APPs -  Physician Assistants and Nurse Practitioners) who all work together to provide you with the care you need, when you need it. ? ?We recommend signing up for the patient portal called "MyChart".  Sign up information is provided on this After Visit Summary.  MyChart is used to connect with patients for Virtual Visits (Telemedicine).  Patients are able to view lab/test results, encounter notes, upcoming appointments, etc.  Non-urgent messages can be sent to your provider as well.   ?To learn more about what you can do with MyChart, go to [email or web address].   ? ?Your next appointment:   ?12 month(s) ? ?The format for your next appointment:   ?In Person ? ?Provider:   ?Lauree Chandler, MD   ? ? ?Other Instructions ?Your physician discussed the importance of regular exercise and recommended that you start or continue a regular exercise program for good health and exercise at least 150 minutes a  week. ? ? ?Important Information About Sugar ? ? ? ? ?  ?

## 2021-09-07 ENCOUNTER — Telehealth: Payer: Self-pay

## 2021-09-07 ENCOUNTER — Ambulatory Visit (INDEPENDENT_AMBULATORY_CARE_PROVIDER_SITE_OTHER): Payer: Medicare Other | Admitting: Gastroenterology

## 2021-09-07 ENCOUNTER — Encounter: Payer: Self-pay | Admitting: Gastroenterology

## 2021-09-07 VITALS — BP 158/80 | HR 75 | Ht 71.0 in | Wt 242.0 lb

## 2021-09-07 DIAGNOSIS — K5520 Angiodysplasia of colon without hemorrhage: Secondary | ICD-10-CM | POA: Diagnosis not present

## 2021-09-07 DIAGNOSIS — D696 Thrombocytopenia, unspecified: Secondary | ICD-10-CM

## 2021-09-07 DIAGNOSIS — D500 Iron deficiency anemia secondary to blood loss (chronic): Secondary | ICD-10-CM

## 2021-09-07 DIAGNOSIS — K31819 Angiodysplasia of stomach and duodenum without bleeding: Secondary | ICD-10-CM

## 2021-09-07 DIAGNOSIS — K760 Fatty (change of) liver, not elsewhere classified: Secondary | ICD-10-CM

## 2021-09-07 LAB — NASH FIBROSURE
ALPHA 2-MACROGLOBULINS, QN: 260 mg/dL (ref 110–276)
ALT (SGPT) P5P: 46 IU/L (ref 0–55)
AST (SGOT) P5P: 63 IU/L — ABNORMAL HIGH (ref 0–40)
Apolipoprotein A-1: 109 mg/dL (ref 101–178)
Bilirubin, Total: 0.2 mg/dL (ref 0.0–1.2)
Cholesterol, Total: 80 mg/dL — ABNORMAL LOW (ref 100–199)
Fibrosis Score: 0.54 — ABNORMAL HIGH (ref 0.00–0.21)
GGT: 69 IU/L — ABNORMAL HIGH (ref 0–65)
Glucose: 151 mg/dL — ABNORMAL HIGH (ref 70–99)
Haptoglobin: 95 mg/dL (ref 34–355)
Height: 71 in
NASH Score: 0.75 — ABNORMAL HIGH
Steatosis Score: 0.8 — ABNORMAL HIGH (ref 0.00–0.30)
Triglycerides: 233 mg/dL — ABNORMAL HIGH (ref 0–149)
Weight: 242 [lb_av]

## 2021-09-07 LAB — FIB-4 W/REFLEX NASH FIBROSURE
ALT: 40 IU/L (ref 0–44)
AST: 51 IU/L — ABNORMAL HIGH (ref 0–40)
FIB-4 Index: 5.96 — ABNORMAL HIGH (ref 0.00–2.67)
Platelets: 96 10*3/uL — CL (ref 150–450)

## 2021-09-07 NOTE — Progress Notes (Signed)
? ?Chief Complaint:    Iron deficiency anemia, lab review ? ?GI History: 71 year old male with history of HTN, HLD, mild nonobstructive CAD with normal LV function, CVA 2011, OSA (intolerant to CPAP), diabetes, nephrolithiasis, GERD, thrombocytopenia, anemia. ? ?Hospital admission 07/08/2021 with exertional dyspnea x3-4 months and dark stools x2 months, left flank pain.  Admission Hgb 8.0 --> 7.5 (baseline ~10.4) ?- Treated with IV Feraheme-Ferrlecit x3, high-dose PPI ?- 07/10/2021: EGD: Single nonbleeding AVM in stomach treated with APC, mild GAVE treated with APC.  Gastric biopsies negative for H. pylori ?- 07/10/2021: Colonoscopy: Several colonic AVMs treated with APC, two 3 mm tubular adenomas removed from ascending colon, 4 mm adenoma removed from rectum.  Solitary rectal ulcer ?- Discharged with Protonix 40 mg bid and Carafate and ferrous sulfate ?- 07/11/2021: Abdominal ultrasound: Diffusely coarsened hepatic echotexture, splenomegaly with patent portal vein ?- 08/05/2021: GI follow-up appointment.  Reports improved energy, normal stools.  Was still taking pantoprazole 40 mg/day and ferrous sulfate 325 mg/day.  Improved H/H 11.6/36.5 with iron indices.  PLT 78.  Performed extended work-up for underlying liver disease given thrombocytopenia and abnormal ultrasound ?- 08/20/2021: H/H 12.7/38.6, PLT 81.  AST 48, otherwise normal liver enzymes.  Normal/negative A1 AT, ANA, AMA, ASMA, HCV, HBV (needs hepatitis B vaccine), HAV Ab+.  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 ?HPI:   ? ? ?Patient is a 71 y.o. male presenting to the Gastroenterology Clinic for follow-up.  He states that he continues to feel better with improved energy levels.  Back to working again.  Denies any dark stools.  Good p.o. intake with bowel habits back to baseline.  Presents today with his wife and  son. ? ? ?PLT low 100's since 2014.  ? ? ? ?Review of systems:     No chest pain, no SOB, no fevers, no urinary sx  ? ?Past Medical History:  ?Diagnosis Date  ? Chest pain   ? a. 10.2006 Cath: nl cors, EF 65%;  b. 12/2008 Negative myoview.  ? DDD (degenerative disc disease), cervical   ? a. 08/2008 s/p R L4-5 diskectomy, foaminotomy, and lysis of adhesions.  ? Diabetes ([place])   ? GERD (gastroesophageal reflux disease)   ? Gout   ? History of kidney stones   ? Hyperlipidemia   ? Hypertension   ? OSA (obstructive sleep apnea)   ? a. on cpap  NOT ABLE TO USE  ? Palpitations   ? a. 11/2008 - nl holter monitor;  b. 04/2010 Echo: EF 50-55%, Gr 1 DD.  ? Skin cancer   ? Stroke [facility])   ? a. 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  ? ? ?Patient's surgical history, family medical history, social history, medications and allergies were all reviewed in Epic  ? ? ?Current Outpatient Medications  ?Medication Sig Dispense Refill  ? acetaminophen (TYLENOL) 500 MG tablet Take 1,000 mg by mouth every 6 (six) hours as needed for mild pain.    ? allopurinol (ZYLOPRIM) 300 MG tablet Take 300 mg by mouth at bedtime.    ? aspirin EC 81 MG tablet Take 81 mg by mouth every evening.    ? ferrous sulfate 325 (65 FE) MG tablet Take 1 tablet (325 mg total) by mouth daily. 90 tablet 3  ? fexofenadine (ALLEGRA) 180 MG tablet Take 180 mg by mouth daily as needed (allergies).    ?  metFORMIN (GLUCOPHAGE-XR) 500 MG 24 hr tablet Take 500 mg by mouth 2 (two) times daily.    ? metoprolol succinate (TOPROL-XL) 50 MG 24 hr tablet Take 50 mg by mouth at bedtime.    ? pantoprazole (PROTONIX) 40 MG tablet Take 1 tablet (40 mg total) by mouth 2 (two) times daily. 60 tablet 0  ? ?No current facility-administered medications for this visit.  ? ? ?Physical Exam:   ? ? ?BP (!) 158/80 (BP Location: Right Arm, Patient Position: Sitting, Cuff Size: Normal)   Pulse 75   Ht '5\' 11"'$  (1.803 m)   Wt 242 lb (109.8 kg)   SpO2 99%   BMI 33.75 kg/m?  ? ?GENERAL:   Pleasant male in NAD ?PSYCH: : Cooperative, normal affect ?NEURO: Alert and oriented x 3, no focal neurologic deficits ? ? ?IMPRESSION and PLAN:   ? 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  Elevated fib 4 (5.96) and NASH FibroSure all consistent with NASH.  Discussed risk of progression to cirrhosis ?- Ultrasound elastography to evaluate liver stiffness ?- Aside from AST 48, otherwise normal liver enzymes ?- Aside from Thrombocytopenia, no serologic e/o impaired hepatic synthetic function.  Does have splenomegaly though on imaging.  No esophageal varices ?- Can d/w PCM to consider changing metformin to pioglitazone or GLP-1 agonist for benefit in NASH ?- Described benefits of drinking black coffee with no additives ?- Continue treatment of underlying diabetes, HTN, HLD ? ?6) Thrombocytopenia ?- Platelets have been in the low 100s since 2014.  Splenomegaly on most recent imaging.  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  Unsure of additional benefit with Hematology referral at this juncture ?- Continue monitoring. ? ? ?RTC in 3-6 months or sooner as needed ? ?I spent 30 minutes of time, including in depth chart review, independent review of results as outlined above, communicating results with the patient directly, face-to-face time with the patient, coordinating care, and ordering studies and medications as appropriate, and documentation. ? ?    ?    ? ?Lavena Bullion ,DO, FACG 09/07/2021, 10:56 AM ? ?

## 2021-09-07 NOTE — Patient Instructions (Signed)
If you are age 72 or older, your body mass index should be between 23-30. Your Body mass index is 33.75 kg/m?Marland Kitchen If this is out of the aforementioned range listed, please consider follow up with your Primary Care Provider. ? ?__________________________________________________________ ? ?The [facility] providers would like to encourage you to use [facility] to communicate with providers for non-urgent requests or questions.  Due to long hold times on the telephone, sending your provider a message by [facility] may be a faster and more efficient way to get a response.  Please allow 48 business hours for a response.  Please remember that this is for non-urgent requests.  ? ?Due to recent changes in healthcare laws, you may see the results of your imaging and laboratory studies on MyChart before your provider has had a chance to review them.  We understand that in some cases there may be results that are confusing or concerning to you. Not all laboratory results come back in the same time frame and the provider may be waiting for multiple results in order to interpret others.  Please give [place] 48 hours in order for your provider to thoroughly review all the results before contacting the office for clarification of your results.  ? ?You will be contacted by [facility] [facility] in the next 2 days to arrange an appointment for abdominal ultrasound with elastography   The number on your caller ID will be [phone], please answer when they call.  If you have not heard from them in 2 days please call [phone] to schedule.    ? ?You have been scheduled for an abdominal ultrasound with elastography at [facility] Radiology (1st floor). Your appointment is scheduled for ________ at _______. Please arrive 15 minutes prior to your scheduled appointment for registration purposes. Make certain not to have anything to eat or drink 6 hours prior to your procedure. Should you need to reschedule your appointment, you may contact  radiology at [phone]. ? ?Liver Elastography ?Various chronic liver diseases such as hepatitis B, C, and fatty liver disease can lead to tissue damage and subsequent scar tissue formation. As the scar tissue accumulates, the liver loses some of its elasticity and becomes stiffer. ?Liver elastography involves the use of a surface ultrasound probe that delivers a low frequency pulse or shear wave to a small volume of liver tissue under the rib cage. The transmission of the sound wave is completely painless. ?How Is a Liver Elastography Performed? ?The liver is located in the right upper abdomen under the rib cage. Patients are asked to lie flat on an examination table. A technician places the FibroScan probe between the ribs on the right side of the lower chest wall. A series of 10 painless pulses are then applied to the liver. The results are recorded on the equipment and an overall liver stiffness score is generated. This score is then interpreted by a qualified physician to predict the likelihood of advanced fibrosis or cirrhosis.  ?Patients are asked to wear loose clothing and should not consume any liquids or solids for a minimum of 4 hours before the test to increase the likelihood of obtaining reliable test results. The scan will take 10 to 15 minutes to complete, but patients should plan on being available for 30 minutes to allow time for preparation.  ? ?Please follow up in 3 months. Give [place] a call at [phone] to schedule an appointment.  ? ?Thank you for choosing me and [facility]. ? ?Gerrit Heck,  D.O. ? ? ?We want to thank you for trusting [facility] with your care. All of our staff and providers value the relationships we have built with our patients, and it is an honor to care for you.  ? ?We are writing to let you know that [facility] will close on Oct 05, 2021, and we invite you to continue to see Dr. Carmell Austria and Gerrit Heck at the [facility] [place] office location. We are consolidating our serices at these [facility] practices to better provide care. Our office staff will work with you to ensure a seamless transition.  ? ?Gerrit Heck, DO -Dr. Bryan Lemma will be movig to [facility] at [address], [place], [place] [address], effective Oct 05, 2021.  Contact [phone] to schedule an appointment with him.  ? ?Carmell Austria, MD- Dr. Lyndel Safe will be movig to [facility] at [address] [place], [place] [address], effective Oct 05, 2021.  Contact [phone] to schedule an appointment with him.  ? ?Requesting Medical Records ?If you need to request your medical records, please follow the instructions below. Your medical records are confidential, and a copy can be transferred to another provider or released to you or another person you designate only with your permission. ? ?There are several ways to request your medical records: ?Requests for medical records can be submitted through our practice.   ?You can also request your records electronically, in your MyChart account by selecting the ?Request Health Records? tab.  ?If you need additional information on how to request records, please go to [email or web address]/, choose Patient Information, then select Request Medical Records. ?To make an appointment or if you have any questions about your health care needs, please contact our office at [phone] and one of our staff members will be glad to assist you. ?[facility] is committed to providing exceptional care for you and our community. Thank you for allowing [place] to serve your health care needs. ?Sincerely, ? ?Windy Canny, Director [facility] ?[facility] also offers convenient virtual care options. Sore throat? Sinus problems? Cold or flu symptoms? Get care from the comfort of home with [facility] Video Visits and e-Visits. Learn more about the non-emergency  conditions treated and start your virtual visit at [email or web address]  ? ?

## 2021-09-07 NOTE — Telephone Encounter (Signed)
Spoke with Janett Billow, [facility], gave information of patient's height and weight for Fibrosure result.  ?

## 2021-09-10 ENCOUNTER — Other Ambulatory Visit: Payer: Self-pay | Admitting: Internal Medicine

## 2021-09-15 ENCOUNTER — Ambulatory Visit (HOSPITAL_COMMUNITY)
Admission: RE | Admit: 2021-09-15 | Discharge: 2021-09-15 | Disposition: A | Discharge status: Home / Self Care | Payer: Medicare Other | Source: Ambulatory Visit | Attending: Gastroenterology | Admitting: Gastroenterology

## 2021-09-15 DIAGNOSIS — K760 Fatty (change of) liver, not elsewhere classified: Secondary | ICD-10-CM | POA: Insufficient documentation

## 2021-09-15 DIAGNOSIS — D500 Iron deficiency anemia secondary to blood loss (chronic): Secondary | ICD-10-CM | POA: Insufficient documentation

## 2021-09-15 DIAGNOSIS — K5520 Angiodysplasia of colon without hemorrhage: Secondary | ICD-10-CM | POA: Diagnosis present

## 2021-09-23 ENCOUNTER — Telehealth: Payer: Self-pay | Admitting: Pharmacist

## 2021-09-23 MED ORDER — ROSUVASTATIN CALCIUM 10 MG PO TABS: 10.0000 mg | ORAL_TABLET | Freq: Every day | ORAL | 3 refills | Status: DC

## 2021-09-23 NOTE — Progress Notes (Deleted)
Patient ID: Alexander [name]                 DOB: 09/29/1950                    MRN: [id number]     HPI: Alexander [name] is a 71 y.o. male patient of Dr Angelena Form referred to lipid clinic by Ermalinda Barrios, PA. PMH is significant for DM, HTN, HLD, stroke in 2011, chest pain, OSA on CPAP, gout, NASH/fatty liver, and GERD. Cardiac cath 07/25/20 with mild non-obstructive CAD (20% proximal RCA stenosis, 20% mid Circumflex stenosis). Pt previously on Crestor 3x a week. This was stopped due to possible cirrhosis due to fatty liver and pt was referred to lipid clinic.  Crestor '5mg'$  previously rx for 3x week but pt taking daily? Tolerability issues? Msg sent to GI regarding restarting crestor  Current Medications: none Intolerances:  Risk Factors:  LDL goal:   Diet:   Exercise:   Family History: Heart attack in his father; Stomach cancer in his mother.   Social History: Former tobacco use, quit in 1985, quit alcohol in 1983, denies drug use.  Labs: 08/20/21: TC 80, TG 233 10/15/20: HDL 35, LDL 25   Past Medical History:  Diagnosis Date   Chest pain    a. 10.2006 Cath: nl cors, EF 65%;  b. 12/2008 Negative myoview.   DDD (degenerative disc disease), cervical    a. 08/2008 s/p R L4-5 diskectomy, foaminotomy, and lysis of adhesions.   Diabetes ([place])    GERD (gastroesophageal reflux disease)    Gout    History of kidney stones    Hyperlipidemia    Hypertension    OSA (obstructive sleep apnea)    a. on cpap  NOT ABLE TO USE   Palpitations    a. 11/2008 - nl holter monitor;  b. 04/2010 Echo: EF 50-55%, Gr 1 DD.   Skin cancer    Stroke [facility])    a. 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    Current Outpatient Medications on File Prior to Visit  Medication Sig Dispense Refill   acetaminophen (TYLENOL) 500 MG tablet Take 1,000 mg by mouth every 6 (six) hours as needed for mild pain.     allopurinol (ZYLOPRIM) 300 MG tablet Take 300 mg by mouth at bedtime.     aspirin EC 81 MG tablet Take 81 mg  by mouth every evening.     ferrous sulfate 325 (65 FE) MG tablet Take 1 tablet (325 mg total) by mouth daily. 90 tablet 3   fexofenadine (ALLEGRA) 180 MG tablet Take 180 mg by mouth daily as needed (allergies).     metFORMIN (GLUCOPHAGE-XR) 500 MG 24 hr tablet Take 500 mg by mouth 2 (two) times daily.     metoprolol succinate (TOPROL-XL) 50 MG 24 hr tablet Take 50 mg by mouth at bedtime.     pantoprazole (PROTONIX) 40 MG tablet Take 1 tablet (40 mg total) by mouth 2 (two) times daily. 60 tablet 0   No current facility-administered medications on file prior to visit.    Allergies  Allergen Reactions   Contrast Media [Iodinated Contrast Media] Other (See Comments)    STROKE   Gadolinium Other (See Comments)     Code: VOM, Desc: unusual reaction, patient was nauseous, lost consciousness, Onset Date: [id number]     Assessment/Plan:  1. Hyperlipidemia -

## 2021-09-23 NOTE — Telephone Encounter (Signed)
-----   Message from Lavena Bullion, DO sent at 09/23/2021  3:08 PM EDT ----- ?Regarding: RE: Statin? ?Jinny Blossom, ? ?Thanks for reaching out.  Yes, I am perfectly fine with him continuing statin, and in fact is probably beneficial for his underlying NASH.  ? ?Thanks! ? ?Vito ? ?----- Message ----- ?From: Leeroy Bock, RPH-CPP ?Sent: 09/23/2021   2:13 PM EDT ?To: Lavena Bullion, DO ?Subject: Statin?                                       ? ?Hi Dr Bryan Lemma, ? ?I wanted to reach out regarding mutual pt Mr Mazzocco. Cardiology has been managing his lipids and it looks like his Crestor was discontinued recently given his NASH diagnosis. Given his stable LFTs, are you comfortable with [place] resuming a low dose of Crestor with close LFT monitoring? ? ?Thanks! ?Kadi Hession ? ? ?

## 2021-09-23 NOTE — Telephone Encounter (Signed)
Called pt to discuss restarting his statin. Prior lipids very well controlled on Crestor. He is willing to resume statin therapy, will start Crestor '10mg'$  daily. PharmD appt for tomorrow canceled. ?

## 2021-09-24 ENCOUNTER — Ambulatory Visit: Payer: Medicare Other

## 2021-09-30 ENCOUNTER — Other Ambulatory Visit (INDEPENDENT_AMBULATORY_CARE_PROVIDER_SITE_OTHER): Payer: Medicare Other

## 2021-09-30 DIAGNOSIS — D500 Iron deficiency anemia secondary to blood loss (chronic): Secondary | ICD-10-CM | POA: Diagnosis not present

## 2021-09-30 DIAGNOSIS — R932 Abnormal findings on diagnostic imaging of liver and biliary tract: Secondary | ICD-10-CM

## 2021-09-30 LAB — CBC WITH DIFFERENTIAL/PLATELET
Basophils Absolute: 0.1 10*3/uL (ref 0.0–0.1)
Basophils Relative: 1.1 % (ref 0.0–3.0)
Eosinophils Absolute: 0.1 10*3/uL (ref 0.0–0.7)
Eosinophils Relative: 2.1 % (ref 0.0–5.0)
HCT: 41.3 % (ref 39.0–52.0)
Hemoglobin: 13.9 g/dL (ref 13.0–17.0)
Lymphocytes Relative: 20.1 % (ref 12.0–46.0)
Lymphs Abs: 1 10*3/uL (ref 0.7–4.0)
MCHC: 33.5 g/dL (ref 30.0–36.0)
MCV: 89.3 fl (ref 78.0–100.0)
Monocytes Absolute: 0.5 10*3/uL (ref 0.1–1.0)
Monocytes Relative: 10.4 % (ref 3.0–12.0)
Neutro Abs: 3.3 10*3/uL (ref 1.4–7.7)
Neutrophils Relative %: 66.3 % (ref 43.0–77.0)
Platelets: 74 10*3/uL — ABNORMAL LOW (ref 150.0–400.0)
RBC: 4.63 Mil/uL (ref 4.22–5.81)
RDW: 21.8 % — ABNORMAL HIGH (ref 11.5–15.5)
WBC: 4.9 10*3/uL (ref 4.0–10.5)

## 2021-09-30 LAB — BASIC METABOLIC PANEL
BUN: 11 mg/dL (ref 6–23)
CO2: 26 mEq/L (ref 19–32)
Calcium: 9.5 mg/dL (ref 8.4–10.5)
Chloride: 101 mEq/L (ref 96–112)
Creatinine, Ser: 1.03 mg/dL (ref 0.40–1.50)
GFR: 72.91 mL/min (ref >60.00)
Glucose, Bld: 157 mg/dL — ABNORMAL HIGH (ref 70–99)
Potassium: 4 mEq/L (ref 3.5–5.1)
Sodium: 137 mEq/L (ref 135–145)

## 2021-09-30 LAB — HEPATIC FUNCTION PANEL
ALT: 29 U/L (ref 0–53)
AST: 35 U/L (ref 0–37)
Albumin: 4.1 g/dL (ref 3.5–5.2)
Alkaline Phosphatase: 61 U/L (ref 39–117)
Bilirubin, Direct: 0.4 mg/dL — ABNORMAL HIGH (ref 0.0–0.3)
Total Bilirubin: 1.2 mg/dL (ref 0.2–1.2)
Total Protein: 7.9 g/dL (ref 6.0–8.3)

## 2021-09-30 LAB — PROTIME-INR
INR: 1.2 ratio — ABNORMAL HIGH (ref 0.8–1.0)
Prothrombin Time: 13 s (ref 9.6–13.1)

## 2021-10-02 LAB — IGG: IgG (Immunoglobin G), Serum: 1459 mg/dL (ref 600–1540)

## 2021-10-02 LAB — AFP TUMOR MARKER: AFP-Tumor Marker: 2.4 ng/mL (ref <6.1)

## 2021-10-05 ENCOUNTER — Other Ambulatory Visit: Payer: Self-pay

## 2021-10-05 DIAGNOSIS — K760 Fatty (change of) liver, not elsewhere classified: Secondary | ICD-10-CM

## 2021-10-05 DIAGNOSIS — K7460 Unspecified cirrhosis of liver: Secondary | ICD-10-CM

## 2021-12-21 ENCOUNTER — Ambulatory Visit (INDEPENDENT_AMBULATORY_CARE_PROVIDER_SITE_OTHER): Payer: Medicare Other | Admitting: Gastroenterology

## 2021-12-21 ENCOUNTER — Encounter: Payer: Self-pay | Admitting: Gastroenterology

## 2021-12-21 VITALS — BP 128/70 | HR 76 | Ht 71.0 in | Wt 240.4 lb

## 2021-12-21 DIAGNOSIS — D500 Iron deficiency anemia secondary to blood loss (chronic): Secondary | ICD-10-CM | POA: Diagnosis not present

## 2021-12-21 DIAGNOSIS — D696 Thrombocytopenia, unspecified: Secondary | ICD-10-CM | POA: Diagnosis not present

## 2021-12-21 DIAGNOSIS — K5520 Angiodysplasia of colon without hemorrhage: Secondary | ICD-10-CM

## 2021-12-21 DIAGNOSIS — K7581 Nonalcoholic steatohepatitis (NASH): Secondary | ICD-10-CM

## 2021-12-21 DIAGNOSIS — R161 Splenomegaly, not elsewhere classified: Secondary | ICD-10-CM

## 2021-12-21 DIAGNOSIS — Z86010 Personal history of colonic polyps: Secondary | ICD-10-CM

## 2021-12-21 DIAGNOSIS — K31819 Angiodysplasia of stomach and duodenum without bleeding: Secondary | ICD-10-CM

## 2021-12-21 NOTE — Progress Notes (Unsigned)
Chief Complaint:    NASH follow-up  GI History: 71 year old male with history of HTN, HLD, mild nonobstructive CAD with normal LV function, CVA 2011, OSA (intolerant to CPAP), diabetes, nephrolithiasis, GERD, thrombocytopenia, anemia.  Hospital admission 07/08/2021 with exertional dyspnea x3-4 months and dark stools x2 months, left flank pain.  Admission Hgb 8.0 --> 7.5 (baseline ~10.4) - Treated with IV Feraheme-Ferrlecit x3, high-dose PPI - 07/10/2021: EGD: Single nonbleeding AVM in stomach treated with APC, mild GAVE treated with APC.  Gastric biopsies negative for H. pylori - 07/10/2021: Colonoscopy: Several colonic AVMs treated with APC, two 3 mm tubular adenomas removed from ascending colon, 4 mm adenoma removed from rectum.  Solitary rectal ulcer - Discharged with Protonix 40 mg bid and Carafate and ferrous sulfate - 07/11/2021: Abdominal ultrasound: Diffusely coarsened hepatic echotexture, splenomegaly with patent portal vein - 08/05/2021: GI follow-up appointment.  Reports improved energy, normal stools.  Was still taking pantoprazole 40 mg/day and ferrous sulfate 325 mg/day.  Improved H/H 11.6/36.5 with iron indices.  PLT 78.  Performed extended work-up for underlying liver disease given thrombocytopenia and abnormal ultrasound - 08/20/2021: H/H 12.7/38.6, PLT 81.  AST 48, otherwise normal liver enzymes.  Normal/negative A1 AT, ANA, AMA, ASMA, HCV, HBV (needs hepatitis B vaccine), HAV Ab+.  FIB-4 5.96, NASH FibroSURE fibrosis score 0.54 (F2, bridging fibrosis with few septa), steatosis score 0.8 (S3, marked or severe steatosis), NASH score 0.75 (N2, definite NASH) -09/15/2021: Ultrasound elastography: Hepatic steatosis on ultrasound portion but normal elastography (median kPa 5.6), essentially ruling out compensated advanced chronic liver disease - 09/30/2021: PLT 74, otherwise normal CBC.  AST/ALT 35/29, T. bili 1.2, albumin 4.1.  INR 1.2.  AFP normal  HPI:     Patient is a 71 y.o. male  presenting to the Gastroenterology Clinic for follow-up.  Was last seen by me on 09/07/2021.  Was diagnosed with NASH referred for ultrasound elastography.  Today, he states he is in his usual state of health.  No active issues or complaints today.  Reviewed most recent labs from May as above; anemia has since resolved, but still with thrombocytopenia.  Liver enzymes have normalized.  Changed metformin to Jardiance   Review of systems:     No chest pain, no SOB, no fevers, no urinary sx   Past Medical History:  Diagnosis Date   Chest pain    a. 10.2006 Cath: nl cors, EF 65%;  b. 12/2008 Negative myoview.   DDD (degenerative disc disease), cervical    a. 08/2008 s/p R L4-5 diskectomy, foaminotomy, and lysis of adhesions.   Diabetes ([place])    GERD (gastroesophageal reflux disease)    Gout    History of kidney stones    Hyperlipidemia    Hypertension    OSA (obstructive sleep apnea)    a. on cpap  NOT ABLE TO USE   Palpitations    a. 11/2008 - nl holter monitor;  b. 04/2010 Echo: EF 50-55%, Gr 1 DD.   Skin cancer    Stroke [facility])    a. 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    Patient's surgical history, family medical history, social history, medications and allergies were all reviewed in Epic    Current Outpatient Medications  Medication Sig Dispense Refill   acetaminophen (TYLENOL) 500 MG tablet Take 1,000 mg by mouth every 6 (six) hours as needed for mild pain.     allopurinol (ZYLOPRIM) 300 MG tablet Take 300 mg by mouth at bedtime.     aspirin  EC 81 MG tablet Take 81 mg by mouth every evening.     empagliflozin (JARDIANCE) 10 MG TABS tablet Take 10 mg by mouth daily.     ferrous sulfate 325 (65 FE) MG tablet Take 1 tablet (325 mg total) by mouth daily. 90 tablet 3   fexofenadine (ALLEGRA) 180 MG tablet Take 180 mg by mouth daily as needed (allergies).     metoprolol succinate (TOPROL-XL) 50 MG 24 hr tablet Take 50 mg by mouth at bedtime.     Ondansetron HCl (ZOFRAN PO)  Take 1 tablet by mouth as needed.     pantoprazole (PROTONIX) 40 MG tablet Take 1 tablet (40 mg total) by mouth 2 (two) times daily. 60 tablet 0   rosuvastatin (CRESTOR) 10 MG tablet Take 1 tablet (10 mg total) by mouth daily. (Patient not taking: Reported on 12/21/2021) 90 tablet 3   No current facility-administered medications for this visit.    Physical Exam:     BP 128/70   Pulse 76   Ht '5\' 11"'$  (1.803 m)   Wt 240 lb 6 oz (109 kg)   BMI 33.53 kg/m   GENERAL:  Pleasant *** in NAD PSYCH: : Cooperative, normal affect EENT:  conjunctiva pink, mucous membranes moist, neck supple without masses CARDIAC:  RRR, ***murmur heard, no peripheral edema PULM: Normal respiratory effort, lungs CTA bilaterally, no wheezing ABDOMEN:  Nondistended, soft, nontender. No obvious masses, no hepatomegaly,  normal bowel sounds SKIN:  turgor, no lesions seen Musculoskeletal:  Normal muscle tone, normal strength NEURO: Alert and oriented x 3, no focal neurologic deficits   IMPRESSION and PLAN:    1)   - Changed DM meds - Will stop iron now - Repeat CBC and liver enzymes in 1 year - Will thiknk about Heme referral for PLTs           Khyle Goodell V Mychal Decarlo ,DO, FACG 12/21/2021, 12:00 PM

## 2021-12-21 NOTE — Patient Instructions (Addendum)
If you are age 72 or older, your body mass index should be between 23-30. Your Body mass index is 33.53 kg/m. If this is out of the aforementioned range listed, please consider follow up with your Primary Care Provider.  ________________________________________________________  The [place] [facility] providers would like to encourage you to use [facility] to communicate with providers for non-urgent requests or questions.  Due to long hold times on the telephone, sending your provider a message by [facility] may be a faster and more efficient way to get a response.  Please allow 48 business hours for a response.  Please remember that this is for non-urgent requests.  _______________________________________________________  Due to recent changes in healthcare laws, you may see the results of your imaging and laboratory studies on MyChart before your provider has had a chance to review them.  We understand that in some cases there may be results that are confusing or concerning to you. Not all laboratory results come back in the same time frame and the provider may be waiting for multiple results in order to interpret others.  Please give [place] 48 hours in order for your provider to thoroughly review all the results before contacting the office for clarification of your results.   Please follow up in 1 year. Give [place] a call at [phone] to schedule an appointment.   Thank you for choosing me and [facility].  Vito Cirigliano, D.O.

## 2022-04-08 ENCOUNTER — Other Ambulatory Visit: Payer: Medicare Other

## 2022-04-08 DIAGNOSIS — K760 Fatty (change of) liver, not elsewhere classified: Secondary | ICD-10-CM

## 2022-04-08 DIAGNOSIS — K7460 Unspecified cirrhosis of liver: Secondary | ICD-10-CM

## 2022-04-09 LAB — AFP TUMOR MARKER: AFP-Tumor Marker: 2.1 ng/mL (ref <6.1)

## 2022-05-24 HISTORY — PX: OTHER SURGICAL HISTORY: SHX169

## 2022-05-28 ENCOUNTER — Encounter: Payer: Self-pay | Admitting: Gastroenterology

## 2022-08-09 ENCOUNTER — Ambulatory Visit (INDEPENDENT_AMBULATORY_CARE_PROVIDER_SITE_OTHER): Payer: 59 | Admitting: Gastroenterology

## 2022-08-09 ENCOUNTER — Other Ambulatory Visit (INDEPENDENT_AMBULATORY_CARE_PROVIDER_SITE_OTHER): Payer: 59

## 2022-08-09 ENCOUNTER — Encounter: Payer: Self-pay | Admitting: Gastroenterology

## 2022-08-09 VITALS — BP 130/70 | HR 72 | Ht 71.0 in | Wt 239.0 lb

## 2022-08-09 DIAGNOSIS — K7581 Nonalcoholic steatohepatitis (NASH): Secondary | ICD-10-CM

## 2022-08-09 DIAGNOSIS — D696 Thrombocytopenia, unspecified: Secondary | ICD-10-CM

## 2022-08-09 DIAGNOSIS — K5520 Angiodysplasia of colon without hemorrhage: Secondary | ICD-10-CM | POA: Diagnosis not present

## 2022-08-09 DIAGNOSIS — R161 Splenomegaly, not elsewhere classified: Secondary | ICD-10-CM | POA: Diagnosis not present

## 2022-08-09 DIAGNOSIS — E119 Type 2 diabetes mellitus without complications: Secondary | ICD-10-CM

## 2022-08-09 DIAGNOSIS — Z86010 Personal history of colonic polyps: Secondary | ICD-10-CM

## 2022-08-09 LAB — COMPREHENSIVE METABOLIC PANEL
ALT: 28 U/L (ref 0–53)
AST: 39 U/L — ABNORMAL HIGH (ref 0–37)
Albumin: 4 g/dL (ref 3.5–5.2)
Alkaline Phosphatase: 61 U/L (ref 39–117)
BUN: 17 mg/dL (ref 6–23)
CO2: 27 mEq/L (ref 19–32)
Calcium: 9.5 mg/dL (ref 8.4–10.5)
Chloride: 103 mEq/L (ref 96–112)
Creatinine, Ser: 1.19 mg/dL (ref 0.40–1.50)
GFR: 60.94 mL/min (ref >60.00)
Glucose, Bld: 119 mg/dL — ABNORMAL HIGH (ref 70–99)
Potassium: 3.7 mEq/L (ref 3.5–5.1)
Sodium: 138 mEq/L (ref 135–145)
Total Bilirubin: 0.8 mg/dL (ref 0.2–1.2)
Total Protein: 7.6 g/dL (ref 6.0–8.3)

## 2022-08-09 LAB — CBC
HCT: 32.4 % — ABNORMAL LOW (ref 39.0–52.0)
Hemoglobin: 10.2 g/dL — ABNORMAL LOW (ref 13.0–17.0)
MCHC: 31.5 g/dL (ref 30.0–36.0)
MCV: 70.7 fl — ABNORMAL LOW (ref 78.0–100.0)
Platelets: 101 10*3/uL — ABNORMAL LOW (ref 150.0–400.0)
RBC: 4.58 Mil/uL (ref 4.22–5.81)
RDW: 18.3 % — ABNORMAL HIGH (ref 11.5–15.5)
WBC: 5.1 10*3/uL (ref 4.0–10.5)

## 2022-08-09 LAB — PROTIME-INR
INR: 1.2 ratio — ABNORMAL HIGH (ref 0.8–1.0)
Prothrombin Time: 13.4 s — ABNORMAL HIGH (ref 9.6–13.1)

## 2022-08-09 NOTE — Progress Notes (Signed)
Chief Complaint:    Alexander [name] cirrhosis follow-up, fatigue  GI History: 72 year old male with history of HTN, HLD, mild nonobstructive CAD with normal LV function, CVA 2011, OSA (intolerant to CPAP), diabetes, nephrolithiasis, GERD, thrombocytopenia, anemia.  Hospital admission 07/08/2021 for symptomatic IDA, treated with IV iron, high-dose PPI.   - 07/10/2021: EGD: Single nonbleeding AVM in stomach treated with APC, mild GAVE treated with APC.  Gastric biopsies negative for H. pylori - 07/10/2021: Colonoscopy: Several colonic AVMs treated with APC, two 3 mm tubular adenomas removed from ascending colon, 4 mm adenoma removed from rectum.  Solitary rectal ulcer - 07/11/2021: Abdominal ultrasound: Diffusely coarsened hepatic echotexture, splenomegaly with patent portal vein - 08/20/2021: H/H 12.7/38.6, PLT 81.  AST 48, otherwise normal liver enzymes. Normal/negative A1 AT, ANA, AMA, ASMA, HCV, HBV (needs hepatitis B vaccine), HAV Ab+.  FIB-4 5.96, NASH FibroSURE fibrosis score 0.54 (F2, bridging fibrosis with few septa), steatosis score 0.8 (S3, marked or severe steatosis), NASH score 0.75 (N2, definite NASH) -09/15/2021: Ultrasound elastography: Hepatic steatosis on ultrasound portion but normal elastography (median kPa 5.6), essentially ruling out compensated advanced chronic liver disease - 09/30/2021: PLT 74, otherwise normal CBC.  AST/ALT 35/29, T. bili 1.2, albumin 4.1.  INR 1.2.  AFP normal   HPI:     Patient is a 72 y.o. male presenting to the Gastroenterology Clinic for follow-up.  Last seen by me 12/21/2021.  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  Otherwise no active GI or hepatobiliary issues.  No jaundice, icteric sclera no confusion.  -04/08/2022: AFP negative/normal  No new labs for review since last OV with me, but does state he had recent labs at an outside facility [facility]) and again notable for thrombocytopenia. Will request those records today.     Review of systems:     No chest pain, no SOB, no fevers, no urinary sx   Past Medical History:  Diagnosis Date   Chest pain    a. 10.2006 Cath: nl cors, EF 65%;  b. 12/2008 Negative myoview.   DDD (degenerative disc disease), cervical    a. 08/2008 s/p R L4-5 diskectomy, foaminotomy, and lysis of adhesions.   Diabetes ([place])    GERD (gastroesophageal reflux disease)    Gout    History of kidney stones    Hyperlipidemia    Hypertension    OSA (obstructive sleep apnea)    a. on cpap  NOT ABLE TO USE   Palpitations    a. 11/2008 - nl holter monitor;  b. 04/2010 Echo: EF 50-55%, Gr 1 DD.   Skin cancer    Stroke [facility])    a. 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    Patient's surgical history, family medical history, social history, medications and allergies were all reviewed in Epic    Current Outpatient Medications  Medication Sig Dispense Refill   acetaminophen (TYLENOL) 500 MG tablet Take 1,000 mg by mouth every 6 (six) hours as needed for mild pain.     allopurinol (ZYLOPRIM) 300 MG tablet Take 150 mg by mouth at bedtime.     aspirin EC 81 MG tablet Take 81 mg by mouth every evening.     empagliflozin (JARDIANCE) 10 MG TABS tablet Take 10 mg by mouth daily.     fexofenadine (ALLEGRA) 180 MG tablet Take 180 mg by mouth daily as needed (allergies).     metoprolol succinate (TOPROL-XL) 50 MG 24 hr tablet Take 50 mg by mouth at bedtime.  Ondansetron HCl (ZOFRAN PO) Take 1 tablet by mouth as needed.     pantoprazole (PROTONIX) 40 MG tablet Take 1 tablet (40 mg total) by mouth 2 (two) times daily. 60 tablet 0   No current facility-administered medications for this visit.    Physical Exam:     BP 130/70   Pulse 72   Ht 5\' 11"  (1.803 m)   Wt 239 lb (108.4 kg)   BMI 33.33 kg/m   GENERAL:  Pleasant male, well conversive NEURO: Alert and oriented x 3, no focal neurologic deficits   IMPRESSION and PLAN:    1) NASH  - Liver enzymes and since normalized.   Elevated fib 4 score and NASH FibroSure, but interestingly ultrasound elastography with just hepatic steatosis but normal elastography portion.  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  PLTs have been in the low 100s since 2014, and <100 last year. - Repeat CBC check as above - Previously discussed referral to the Hematology clinic, which he declined.  Can discuss again based on repeat labs.    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  No overt bleeding  7) History of colon polyps - Consider repeat colonoscopy in 2028 for ongoing polyp surveillance     RTC in 1 year or sooner as needed            Lavena Bullion ,DO, FACG 08/09/2022, 2:00 PM

## 2022-08-09 NOTE — Patient Instructions (Signed)
Your provider has requested that you go to the basement level for lab work before leaving today. Press "B" on the elevator. The lab is located at the first door on the left as you exit the elevator.   You have been scheduled for an abdominal ultrasound at [facility] (1st floor of hospital) on 08/16/22 at 10:00 am. Please arrive 30 minutes prior to your appointment for registration. Make certain not to have anything to eat or drink 6  hours prior to your appointment. Should you need to reschedule your appointment, please contact radiology at [phone]. This test typically takes about 30 minutes to perform.   You will be contacted by [place] in the next 2 days to arrange an appointment for Ultrasound.   The number on your caller ID will be [phone], please answer when they call.  If you have not heard from them in 2 days please call [phone] to schedule.     Please follow up in 1 years. Give [place] a call at [phone] to schedule an appointment.  _______________________________________________________  If your blood pressure at your visit was 140/90 or greater, please contact your primary care physician to follow up on this.  _______________________________________________________  If you are age 24 or older, your body mass index should be between 23-30. Your Body mass index is 33.33 kg/m. If this is out of the aforementioned range listed, please consider follow up with your Primary Care Provider.  __________________________________________________________  The [place] [facility] providers would like to encourage you to use [facility] to communicate with providers for non-urgent requests or questions.  Due to long hold times on the telephone, sending your provider a message by [facility] may be a faster and more efficient way to get a response.  Please allow 48 business hours for a response.  Please remember that this is for non-urgent requests.    Due to recent changes  in healthcare laws, you may see the results of your imaging and laboratory studies on MyChart before your provider has had a chance to review them.  We understand that in some cases there may be results that are confusing or concerning to you. Not all laboratory results come back in the same time frame and the provider may be waiting for multiple results in order to interpret others.  Please give [place] 48 hours in order for your provider to thoroughly review all the results before contacting the office for clarification of your results.     Thank you for choosing me and [facility].  Vito Cirigliano, D.O.

## 2022-08-11 ENCOUNTER — Other Ambulatory Visit (INDEPENDENT_AMBULATORY_CARE_PROVIDER_SITE_OTHER): Payer: 59

## 2022-08-11 ENCOUNTER — Other Ambulatory Visit: Payer: Self-pay

## 2022-08-11 DIAGNOSIS — D649 Anemia, unspecified: Secondary | ICD-10-CM | POA: Diagnosis not present

## 2022-08-11 DIAGNOSIS — D500 Iron deficiency anemia secondary to blood loss (chronic): Secondary | ICD-10-CM

## 2022-08-11 LAB — IBC + FERRITIN
Ferritin: 2.8 ng/mL — ABNORMAL LOW (ref 22.0–322.0)
Iron: 18 ug/dL — ABNORMAL LOW (ref 42–165)
Saturation Ratios: 3.7 % — ABNORMAL LOW (ref 20.0–50.0)
TIBC: 483 ug/dL — ABNORMAL HIGH (ref 250.0–450.0)
Transferrin: 345 mg/dL (ref 212.0–360.0)

## 2022-08-11 LAB — VITAMIN B12: Vitamin B-12: 335 pg/mL (ref 211–911)

## 2022-08-11 LAB — FOLATE: Folate: 21.3 ng/mL (ref >5.9)

## 2022-08-13 ENCOUNTER — Encounter: Payer: Self-pay | Admitting: Gastroenterology

## 2022-08-13 ENCOUNTER — Other Ambulatory Visit: Payer: Self-pay

## 2022-08-13 DIAGNOSIS — K31819 Angiodysplasia of stomach and duodenum without bleeding: Secondary | ICD-10-CM

## 2022-08-13 DIAGNOSIS — K5520 Angiodysplasia of colon without hemorrhage: Secondary | ICD-10-CM

## 2022-08-13 DIAGNOSIS — D500 Iron deficiency anemia secondary to blood loss (chronic): Secondary | ICD-10-CM

## 2022-08-13 NOTE — Addendum Note (Signed)
Addended by: Yevette Edwards on: 08/13/2022 04:45 PM   Modules accepted: Orders

## 2022-08-16 ENCOUNTER — Ambulatory Visit (HOSPITAL_COMMUNITY)
Admission: RE | Admit: 2022-08-16 | Discharge: 2022-08-16 | Disposition: A | Discharge status: Home / Self Care | Payer: Medicare HMO | Source: Ambulatory Visit | Attending: Gastroenterology | Admitting: Gastroenterology

## 2022-08-16 ENCOUNTER — Other Ambulatory Visit: Payer: Self-pay | Admitting: Pharmacy Technician

## 2022-08-16 ENCOUNTER — Telehealth: Payer: Self-pay | Admitting: Pharmacy Technician

## 2022-08-16 ENCOUNTER — Encounter: Payer: Self-pay | Admitting: Gastroenterology

## 2022-08-16 DIAGNOSIS — K7581 Nonalcoholic steatohepatitis (NASH): Secondary | ICD-10-CM

## 2022-08-16 DIAGNOSIS — D696 Thrombocytopenia, unspecified: Secondary | ICD-10-CM | POA: Diagnosis present

## 2022-08-16 DIAGNOSIS — D500 Iron deficiency anemia secondary to blood loss (chronic): Secondary | ICD-10-CM

## 2022-08-16 NOTE — Telephone Encounter (Signed)
Dr. Bryan Lemma, Juluis Rainier note:  Auth Submission: NO AUTH NEEDED Site of care: Site of care: [facility] Payer: [employer] Medication & CPT/J Code(s) submitted: Ferrlecit (Ferric Gluconate) [id number] Route of submission (phone, fax, portal):  Phone [phone] Fax # Auth type: Buy/Bill Units/visits requested: 4 Reference number: [id number] Approval from: 08/16/22 to 12/16/22     Patient will be scheduled as soon as possible

## 2022-08-23 ENCOUNTER — Ambulatory Visit (INDEPENDENT_AMBULATORY_CARE_PROVIDER_SITE_OTHER): Payer: Medicare HMO

## 2022-08-23 VITALS — BP 143/77 | HR 62 | Temp 97.9°F | Resp 16 | Ht 71.0 in | Wt 237.6 lb

## 2022-08-23 DIAGNOSIS — K5520 Angiodysplasia of colon without hemorrhage: Secondary | ICD-10-CM | POA: Diagnosis not present

## 2022-08-23 DIAGNOSIS — D500 Iron deficiency anemia secondary to blood loss (chronic): Secondary | ICD-10-CM

## 2022-08-23 DIAGNOSIS — D508 Other iron deficiency anemias: Secondary | ICD-10-CM

## 2022-08-23 DIAGNOSIS — K31819 Angiodysplasia of stomach and duodenum without bleeding: Secondary | ICD-10-CM

## 2022-08-23 DIAGNOSIS — D649 Anemia, unspecified: Secondary | ICD-10-CM

## 2022-08-23 MED ORDER — SODIUM CHLORIDE 0.9 % IV SOLN
250.0000 mg | Freq: Once | INTRAVENOUS | Status: AC
  Administered 2022-08-23: 250 mg via INTRAVENOUS
  Filled 2022-08-23: qty 20

## 2022-08-23 NOTE — Progress Notes (Signed)
Diagnosis: Iron Deficiency Anemia  Provider:  Marshell Garfinkel MD  Procedure: Infusion  IV Type: Peripheral, IV Location: R Forearm  Ferrlecit (ferric gluconate), Dose: 250 mg  Infusion Start Time: [id number] pm  Infusion Stop Time: [id number]  Post Infusion IV Care: Observation period completed and Peripheral IV Discontinued  Discharge: Condition: Good, Destination: Home . AVS Provided  Performed by:  Oren Beckmann, RN

## 2022-08-30 ENCOUNTER — Ambulatory Visit (INDEPENDENT_AMBULATORY_CARE_PROVIDER_SITE_OTHER): Payer: Medicare HMO

## 2022-08-30 VITALS — BP 120/70 | HR 64 | Temp 98.3°F | Resp 16 | Ht 71.0 in | Wt 237.0 lb

## 2022-08-30 DIAGNOSIS — D500 Iron deficiency anemia secondary to blood loss (chronic): Secondary | ICD-10-CM | POA: Diagnosis not present

## 2022-08-30 DIAGNOSIS — K31819 Angiodysplasia of stomach and duodenum without bleeding: Secondary | ICD-10-CM

## 2022-08-30 DIAGNOSIS — K5520 Angiodysplasia of colon without hemorrhage: Secondary | ICD-10-CM | POA: Diagnosis not present

## 2022-08-30 DIAGNOSIS — D649 Anemia, unspecified: Secondary | ICD-10-CM

## 2022-08-30 MED ORDER — SODIUM CHLORIDE 0.9 % IV SOLN
250.0000 mg | Freq: Once | INTRAVENOUS | Status: AC
  Administered 2022-08-30: 250 mg via INTRAVENOUS
  Filled 2022-08-30: qty 20

## 2022-08-30 NOTE — Progress Notes (Signed)
Diagnosis: Iron Deficiency Anemia  Provider:  Chilton Greathouse MD  Procedure: Infusion  IV Type: Peripheral, IV Location: L Antecubital  Ferrlecit (ferric gluconate), Dose: 250mg   Infusion Start Time: 1318  Infusion Stop Time: 1527  Post Infusion IV Care: Peripheral IV Discontinued  Discharge: Condition: Good, Destination: Home . AVS Declined  Performed by:  Loney Hering, LPN

## 2022-09-06 ENCOUNTER — Ambulatory Visit (INDEPENDENT_AMBULATORY_CARE_PROVIDER_SITE_OTHER): Payer: Medicare HMO | Admitting: *Deleted

## 2022-09-06 VITALS — BP 134/74 | HR 66 | Temp 98.1°F | Resp 16 | Ht 71.0 in | Wt 238.2 lb

## 2022-09-06 DIAGNOSIS — K5520 Angiodysplasia of colon without hemorrhage: Secondary | ICD-10-CM | POA: Diagnosis not present

## 2022-09-06 DIAGNOSIS — D500 Iron deficiency anemia secondary to blood loss (chronic): Secondary | ICD-10-CM

## 2022-09-06 DIAGNOSIS — K31819 Angiodysplasia of stomach and duodenum without bleeding: Secondary | ICD-10-CM | POA: Diagnosis not present

## 2022-09-06 DIAGNOSIS — D649 Anemia, unspecified: Secondary | ICD-10-CM

## 2022-09-06 MED ORDER — SODIUM CHLORIDE 0.9 % IV SOLN
250.0000 mg | Freq: Once | INTRAVENOUS | Status: AC
  Administered 2022-09-06: 250 mg via INTRAVENOUS
  Filled 2022-09-06: qty 20

## 2022-09-06 NOTE — Progress Notes (Signed)
Diagnosis: Iron Deficiency Anemia  Provider:  Chilton Greathouse MD  Procedure: Infusion  IV Type: Peripheral, IV Location: R Forearm  Ferrlecit (ferric gluconate), Dose: 250 mg  Infusion Start Time: 1328 pm  Infusion Stop Time: 1544 pm  Post Infusion IV Care: Observation period completed and Peripheral IV Discontinued  Discharge: Condition: Good, Destination: Home . AVS Provided  Performed by:  Forrest Moron, RN

## 2022-09-13 ENCOUNTER — Ambulatory Visit (INDEPENDENT_AMBULATORY_CARE_PROVIDER_SITE_OTHER): Payer: Medicare HMO

## 2022-09-13 VITALS — BP 138/61 | HR 64 | Temp 97.6°F | Resp 18 | Ht 71.0 in | Wt 237.0 lb

## 2022-09-13 DIAGNOSIS — D500 Iron deficiency anemia secondary to blood loss (chronic): Secondary | ICD-10-CM | POA: Diagnosis not present

## 2022-09-13 DIAGNOSIS — K5520 Angiodysplasia of colon without hemorrhage: Secondary | ICD-10-CM

## 2022-09-13 DIAGNOSIS — D649 Anemia, unspecified: Secondary | ICD-10-CM

## 2022-09-13 DIAGNOSIS — K31819 Angiodysplasia of stomach and duodenum without bleeding: Secondary | ICD-10-CM

## 2022-09-13 MED ORDER — SODIUM CHLORIDE 0.9 % IV SOLN
250.0000 mg | Freq: Once | INTRAVENOUS | Status: AC
  Administered 2022-09-13: 250 mg via INTRAVENOUS
  Filled 2022-09-13: qty 20

## 2022-09-13 NOTE — Progress Notes (Signed)
Diagnosis: Iron Deficiency Anemia  Provider:  Chilton Greathouse MD  Procedure: Infusion  IV Type: Peripheral, IV Location: R Antecubital  Ferrlecit (ferric gluconate), Dose:   Infusion Start Time: 1329  Infusion Stop Time: 1533  Post Infusion IV Care: Peripheral IV Discontinued  Discharge: Condition: Good, Destination: Home . AVS Provided  Performed by:  Garnette Czech, RN

## 2022-10-12 ENCOUNTER — Other Ambulatory Visit: Payer: Self-pay

## 2022-10-12 ENCOUNTER — Telehealth: Payer: Self-pay

## 2022-10-12 DIAGNOSIS — K7460 Unspecified cirrhosis of liver: Secondary | ICD-10-CM

## 2022-10-12 DIAGNOSIS — K7581 Nonalcoholic steatohepatitis (NASH): Secondary | ICD-10-CM

## 2022-10-12 NOTE — Telephone Encounter (Signed)
Pt was made aware of reminder that was received in Epic and providers recommendations: Lab entered into epic. Pt made aware. Location to lab provided. Pt verbalized understanding with all questions answered.

## 2022-10-12 NOTE — Telephone Encounter (Signed)
-----   Message from Lucky Cowboy, RN sent at 04/13/2022 11:17 AM EST ----- Patient needs 6 month follow up AFP. Order needs to be placed. Refer to result note 04/08/22.

## 2022-10-20 ENCOUNTER — Encounter: Payer: Self-pay | Admitting: Gastroenterology

## 2022-10-20 DIAGNOSIS — M109 Gout, unspecified: Secondary | ICD-10-CM | POA: Insufficient documentation

## 2022-10-20 DIAGNOSIS — M1A00X0 Idiopathic chronic gout, unspecified site, without tophus (tophi): Secondary | ICD-10-CM | POA: Insufficient documentation

## 2022-10-20 DIAGNOSIS — M5030 Other cervical disc degeneration, unspecified cervical region: Secondary | ICD-10-CM | POA: Insufficient documentation

## 2022-10-20 DIAGNOSIS — M1990 Unspecified osteoarthritis, unspecified site: Secondary | ICD-10-CM | POA: Insufficient documentation

## 2022-10-28 ENCOUNTER — Ambulatory Visit: Payer: Medicare HMO

## 2022-10-28 ENCOUNTER — Encounter: Payer: Self-pay | Admitting: Gastroenterology

## 2022-10-28 VITALS — Ht 71.0 in | Wt 241.0 lb

## 2022-10-28 DIAGNOSIS — Z86010 Personal history of colonic polyps: Secondary | ICD-10-CM

## 2022-10-28 DIAGNOSIS — K31819 Angiodysplasia of stomach and duodenum without bleeding: Secondary | ICD-10-CM

## 2022-10-28 DIAGNOSIS — K7460 Unspecified cirrhosis of liver: Secondary | ICD-10-CM

## 2022-10-28 DIAGNOSIS — K7581 Nonalcoholic steatohepatitis (NASH): Secondary | ICD-10-CM

## 2022-10-28 DIAGNOSIS — K5520 Angiodysplasia of colon without hemorrhage: Secondary | ICD-10-CM

## 2022-10-28 DIAGNOSIS — R161 Splenomegaly, not elsewhere classified: Secondary | ICD-10-CM

## 2022-10-28 DIAGNOSIS — D696 Thrombocytopenia, unspecified: Secondary | ICD-10-CM

## 2022-10-28 MED ORDER — PEG 3350-KCL-NA BICARB-NACL 420 G PO SOLR
4000.0000 mL | Freq: Once | ORAL | 0 refills | Status: AC
Start: 2022-10-28 — End: 2022-10-28

## 2022-10-28 MED ORDER — PEG 3350-KCL-NA BICARB-NACL 420 G PO SOLR
4000.0000 mL | Freq: Once | ORAL | 0 refills | Status: DC
Start: 2022-10-28 — End: 2022-10-28

## 2022-10-28 NOTE — Addendum Note (Signed)
Addended by: Darylene Price on: 10/28/2022 09:34 AM   Modules accepted: Orders

## 2022-10-28 NOTE — Progress Notes (Signed)
No egg or soy allergy known to patient  No issues known to pt with past sedation with any surgeries or procedures Patient denies ever being told they had issues or difficulty with intubation  No FH of Malignant Hyperthermia Pt is not on diet pills Pt is not on  home 02  Pt is not on blood thinners  Pt denies issues with constipation - pt reports not going as much as he use to since taking Jardiance instructions for extra miralax given  No A fib or A flutter Have any cardiac testing pending-- no    Patient's chart reviewed by Cathlyn Parsons CNRA prior to previsit and patient apt is scheduled [facility].   PV completed. Prep instructions reviewed and sent to home address. Pt advised to red all the highlighted areas when he gets his instructions and call the office back if he has any questions.

## 2022-11-02 ENCOUNTER — Encounter (HOSPITAL_COMMUNITY): Payer: Self-pay | Admitting: Gastroenterology

## 2022-11-08 ENCOUNTER — Telehealth: Payer: Self-pay | Admitting: Gastroenterology

## 2022-11-08 NOTE — Telephone Encounter (Signed)
Spoke with patient and let him know that the hospital would not admit him for prep only and that he needed to  begin the  prep an hour early so that it would give them time to be finished with the prep before traveling and hour and half to get here.Patient verbalized understanding of beginning prep an hour early.

## 2022-11-08 NOTE — Telephone Encounter (Signed)
PT is scheduled for an enteroscopy on 6/20 and the family is concerned they will not make it here doing prep without him messing himself up. They wanna know can he be hospitalized during the prep time. Please advise.

## 2022-11-10 NOTE — Anesthesia Preprocedure Evaluation (Addendum)
Anesthesia Evaluation  Patient identified by MRN, date of birth, ID band Patient awake    Reviewed: Allergy & Precautions, NPO status , Patient's Chart, lab work & pertinent test results  Airway Mallampati: II  TM Distance: >3 FB Neck ROM: Full    Dental no notable dental hx.    Pulmonary shortness of breath, sleep apnea , former smoker   Pulmonary exam normal breath sounds clear to auscultation       Cardiovascular hypertension, Pt. on home beta blockers Normal cardiovascular exam Rhythm:Regular Rate:Normal  Echo 07/2020 1. No mural thrombus with Definity. Left ventricular ejection fraction, by estimation, is 60 to 65%. The left ventricle has normal function. The left ventricle has no regional wall motion abnormalities. There is moderate asymmetric left ventricular hypertrophy of the basal-septal segment. Left ventricular diastolic parameters were normal.   2. Right ventricular systolic function is normal. The right ventricular size is normal.   3. The mitral valve is grossly normal. Trivial mitral valve regurgitation.   4. The aortic valve is tricuspid. Aortic valve regurgitation is not visualized. Mild aortic valve sclerosis is present, with no evidence of aortic valve stenosis.     LHC 07/2020  Prox RCA lesion is 20% stenosed.  Mid Cx lesion is 20% stenosed.  The left ventricular systolic function is normal.  LV end diastolic pressure is normal.  The left ventricular ejection fraction is greater than 65% by visual estimate.  There is no mitral valve regurgitation.   1. Mild non-obstructive CAD 2. Normal right and left heart pressures 3. Normal LV systolic function    Neuro/Psych CVA    GI/Hepatic Neg liver ROS,GERD  ,,  Endo/Other  diabetes    Renal/GU      Musculoskeletal  (+) Arthritis ,    Abdominal   Peds  Hematology  (+) Blood dyscrasia, anemia   Anesthesia Other Findings    Reproductive/Obstetrics                              Anesthesia Physical Anesthesia Plan  ASA: 3  Anesthesia Plan: MAC   Post-op Pain Management:    Induction: Intravenous  PONV Risk Score and Plan: 1 and Propofol infusion, Treatment may vary due to age or medical condition and TIVA  Airway Management Planned: Simple Face Mask  Additional Equipment:   Intra-op Plan:   Post-operative Plan:   Informed Consent: I have reviewed the patients History and Physical, chart, labs and discussed the procedure including the risks, benefits and alternatives for the proposed anesthesia with the patient or authorized representative who has indicated his/her understanding and acceptance.     Dental advisory given  Plan Discussed with: CRNA  Anesthesia Plan Comments: (14 months since last cardiology visit. No CP or SOB. Recommended pt contact cardiologist to schedule yearly F/U)         Anesthesia Quick Evaluation

## 2022-11-11 ENCOUNTER — Encounter (HOSPITAL_COMMUNITY)
Admission: RE | Disposition: A | Discharge status: Home / Self Care | Payer: Self-pay | Source: Home / Self Care | Attending: Gastroenterology

## 2022-11-11 ENCOUNTER — Encounter (HOSPITAL_COMMUNITY): Payer: Self-pay | Admitting: Gastroenterology

## 2022-11-11 ENCOUNTER — Ambulatory Visit (HOSPITAL_COMMUNITY)
Admission: RE | Admit: 2022-11-11 | Discharge: 2022-11-11 | Disposition: A | Discharge status: Home / Self Care | Payer: Medicare HMO | Attending: Gastroenterology | Admitting: Gastroenterology

## 2022-11-11 ENCOUNTER — Other Ambulatory Visit: Payer: Self-pay

## 2022-11-11 ENCOUNTER — Ambulatory Visit (HOSPITAL_BASED_OUTPATIENT_CLINIC_OR_DEPARTMENT_OTHER): Payer: Medicare HMO | Admitting: Anesthesiology

## 2022-11-11 ENCOUNTER — Ambulatory Visit (HOSPITAL_COMMUNITY): Payer: Medicare HMO | Admitting: Anesthesiology

## 2022-11-11 DIAGNOSIS — K2970 Gastritis, unspecified, without bleeding: Secondary | ICD-10-CM

## 2022-11-11 DIAGNOSIS — M1990 Unspecified osteoarthritis, unspecified site: Secondary | ICD-10-CM | POA: Insufficient documentation

## 2022-11-11 DIAGNOSIS — D509 Iron deficiency anemia, unspecified: Secondary | ICD-10-CM

## 2022-11-11 DIAGNOSIS — E119 Type 2 diabetes mellitus without complications: Secondary | ICD-10-CM | POA: Insufficient documentation

## 2022-11-11 DIAGNOSIS — Z8249 Family history of ischemic heart disease and other diseases of the circulatory system: Secondary | ICD-10-CM | POA: Insufficient documentation

## 2022-11-11 DIAGNOSIS — G4733 Obstructive sleep apnea (adult) (pediatric): Secondary | ICD-10-CM | POA: Insufficient documentation

## 2022-11-11 DIAGNOSIS — K31819 Angiodysplasia of stomach and duodenum without bleeding: Secondary | ICD-10-CM | POA: Diagnosis not present

## 2022-11-11 DIAGNOSIS — I119 Hypertensive heart disease without heart failure: Secondary | ICD-10-CM

## 2022-11-11 DIAGNOSIS — I10 Essential (primary) hypertension: Secondary | ICD-10-CM | POA: Insufficient documentation

## 2022-11-11 DIAGNOSIS — Z8673 Personal history of transient ischemic attack (TIA), and cerebral infarction without residual deficits: Secondary | ICD-10-CM | POA: Insufficient documentation

## 2022-11-11 DIAGNOSIS — K219 Gastro-esophageal reflux disease without esophagitis: Secondary | ICD-10-CM | POA: Insufficient documentation

## 2022-11-11 DIAGNOSIS — I2510 Atherosclerotic heart disease of native coronary artery without angina pectoris: Secondary | ICD-10-CM | POA: Diagnosis not present

## 2022-11-11 DIAGNOSIS — K5520 Angiodysplasia of colon without hemorrhage: Secondary | ICD-10-CM | POA: Insufficient documentation

## 2022-11-11 DIAGNOSIS — Z7984 Long term (current) use of oral hypoglycemic drugs: Secondary | ICD-10-CM | POA: Diagnosis not present

## 2022-11-11 DIAGNOSIS — Z87891 Personal history of nicotine dependence: Secondary | ICD-10-CM

## 2022-11-11 DIAGNOSIS — D500 Iron deficiency anemia secondary to blood loss (chronic): Secondary | ICD-10-CM

## 2022-11-11 DIAGNOSIS — Z79899 Other long term (current) drug therapy: Secondary | ICD-10-CM | POA: Diagnosis not present

## 2022-11-11 HISTORY — PX: COLONOSCOPY WITH PROPOFOL: SHX5780

## 2022-11-11 HISTORY — PX: ENTEROSCOPY: SHX5533

## 2022-11-11 HISTORY — PX: BIOPSY: SHX5522

## 2022-11-11 HISTORY — PX: HOT HEMOSTASIS: SHX5433

## 2022-11-11 LAB — GLUCOSE, CAPILLARY: Glucose-Capillary: 132 mg/dL — ABNORMAL HIGH (ref 70–99)

## 2022-11-11 SURGERY — ENTEROSCOPY
Anesthesia: Monitor Anesthesia Care

## 2022-11-11 MED ORDER — SODIUM CHLORIDE 0.9 % IV SOLN: INTRAVENOUS | Status: DC

## 2022-11-11 MED ORDER — PROPOFOL 500 MG/50ML IV EMUL
INTRAVENOUS | Status: DC | PRN
  Administered 2022-11-11: 125 ug/kg/min via INTRAVENOUS

## 2022-11-11 MED ORDER — LACTATED RINGERS IV SOLN: INTRAVENOUS | Status: DC | PRN

## 2022-11-11 MED ORDER — LIDOCAINE 2% (20 MG/ML) 5 ML SYRINGE
INTRAMUSCULAR | Status: DC | PRN
  Administered 2022-11-11: 100 mg via INTRAVENOUS

## 2022-11-11 MED ORDER — PROPOFOL 10 MG/ML IV BOLUS
INTRAVENOUS | Status: DC | PRN
  Administered 2022-11-11 (×3): 20 mg via INTRAVENOUS

## 2022-11-11 SURGICAL SUPPLY — 22 items

## 2022-11-11 NOTE — Transfer of Care (Signed)
Immediate Anesthesia Transfer of Care Note  Patient: Alexander [name]  Procedure(s) Performed: ENTEROSCOPY COLONOSCOPY WITH PROPOFOL HOT HEMOSTASIS (ARGON PLASMA COAGULATION/BICAP) BIOPSY  Patient Location: PACU  Anesthesia Type:MAC  Level of Consciousness: awake, alert , and oriented  Airway & Oxygen Therapy: Patient Spontanous Breathing and Patient connected to face mask oxygen  Post-op Assessment: Report given to RN and Post -op Vital signs reviewed and stable  Post vital signs: Reviewed and stable  Last Vitals:  Vitals Value Taken Time  BP    Temp    Pulse    Resp    SpO2      Last Pain:  Vitals:   11/11/22 0726  TempSrc: Oral         Complications: No notable events documented.

## 2022-11-11 NOTE — Op Note (Signed)
[facility] Patient Name: Alexander [name] Procedure Date: 11/11/2022 MRN: [id number] Attending MD: Doristine Locks , MD, [id number] Date of Birth: 05-28-1950 CSN: [id number] Age: 72 Admit Type: Outpatient Procedure:                Small bowel enteroscopy Indications:              Iron deficiency anemia Providers:                Doristine Locks, MD, Fransisca Connors, Salley Scarlet, Technician, [facility], CRNA Referring MD:              Medicines:                Monitored Anesthesia Care Complications:            No immediate complications. Estimated Blood Loss:     Estimated blood loss was minimal. Procedure:                Pre-Anesthesia Assessment:                           - Prior to the procedure, a History and Physical                            was performed, and patient medications and                            allergies were reviewed. The patient's tolerance of                            previous anesthesia was also reviewed. The risks                            and benefits of the procedure and the sedation                            options and risks were discussed with the patient.                            All questions were answered, and informed consent                            was obtained. Prior Anticoagulants: The patient has                            taken no anticoagulant or antiplatelet agents. ASA                            Grade Assessment: III - A patient with severe                            systemic disease. After reviewing the risks and  benefits, the patient was deemed in satisfactory                            condition to undergo the procedure.                           After obtaining informed consent, the endoscope was                            passed under direct vision. Throughout the                            procedure, the patient's blood pressure, pulse, and                             oxygen saturations were monitored continuously. The                            PCF-HQ190L ([id number]) Olympus colonoscope was                            introduced through the mouth and advanced to the                            proximal jejunum. The small bowel enteroscopy was                            accomplished without difficulty. The patient                            tolerated the procedure well. Scope In: Scope Out: Findings:      The examined esophagus was normal.      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 Coagulation for hemostasis using argon plasma was successful.       Estimated blood loss: none.      Diffuse moderate inflammation characterized by congestion (edema),       erythema, and friability was found in the entire examined stomach.       Biopsies were taken with a cold forceps for histology. There was mild       oozing at the biopsy sites, which were successfully treated with APC.       Estimated blood loss was minimal.      There was no evidence of significant pathology in the entire examined       duodenum.      There was no evidence of significant pathology in the proximal jejunum. Impression:               - Normal esophagus.                           - Gastric antral vascular ectasia. Treated with                            argon plasma coagulation (APC).                           -  Gastritis. Biopsied.                           - Normal examined duodenum.                           - The examined portion of the jejunum was normal. Recommendation:           - Await pathology results.                           - Repeat the small bowel enteroscopy PRN for                            retreatment.                           - Perform a colonoscopy today. Procedure Code(s):        --- Professional ---                           [phone], 59, Small intestinal endoscopy, enteroscopy                             beyond second portion of duodenum, not including                            ileum; with control of bleeding (eg, injection,                            bipolar cautery, unipolar cautery, laser, heater                            probe, stapler, plasma coagulator)                           44361, Small intestinal endoscopy, enteroscopy                            beyond second portion of duodenum, not including                            ileum; with biopsy, single or multiple Diagnosis Code(s):        --- Professional ---                           K31.819, Angiodysplasia of stomach and duodenum                            without bleeding                           K29.70, Gastritis, unspecified, without bleeding                           D50.9, Iron deficiency anemia, unspecified CPT copyright 2022 [employer]. All rights reserved. The codes documented in this report  are preliminary and upon coder review may  be revised to meet current compliance requirements. Doristine Locks, MD 11/11/2022 8:59:55 AM Number of Addenda: 0

## 2022-11-11 NOTE — Discharge Instructions (Signed)
YOU HAD AN ENDOSCOPIC PROCEDURE TODAY: Refer to the procedure report and other information in the discharge instructions given to you for any specific questions about what was found during the examination. If this information does not answer your questions, please call [place] office at [phone] to clarify.  ° °YOU SHOULD EXPECT: Some feelings of bloating in the abdomen. Passage of more gas than usual. Walking can help get rid of the air that was put into your GI tract during the procedure and reduce the bloating. If you had a lower endoscopy (such as a colonoscopy or flexible sigmoidoscopy) you may notice spotting of blood in your stool or on the toilet paper. Some abdominal soreness may be present for a day or two, also. ° °DIET: Your first meal following the procedure should be a light meal and then it is ok to progress to your normal diet. A half-sandwich or bowl of soup is an example of a good first meal. Heavy or fried foods are harder to digest and may make you feel nauseous or bloated. Drink plenty of fluids but you should avoid alcoholic beverages for 24 hours.  ° °ACTIVITY: Your care partner should take you home directly after the procedure. You should plan to take it easy, moving slowly for the rest of the day. You can resume normal activity the day after the procedure however YOU SHOULD NOT DRIVE, use power tools, machinery or perform tasks that involve climbing or major physical exertion for 24 hours (because of the sedation medicines used during the test).  ° °SYMPTOMS TO REPORT IMMEDIATELY: °A gastroenterologist can be reached at any hour. Please call [phone]  for any of the following symptoms:  °Following lower endoscopy (colonoscopy, flexible sigmoidoscopy) °Excessive amounts of blood in the stool  °Significant tenderness, worsening of abdominal pains  °Swelling of the abdomen that is new, acute  °Fever of 100° or higher  °Following upper endoscopy (EGD, EUS, ERCP, esophageal  dilation) °Vomiting of blood or coffee ground material  °New, significant abdominal pain  °New, significant chest pain or pain under the shoulder blades  °Painful or persistently difficult swallowing  °New shortness of breath  °Black, tarry-looking or red, bloody stools ° °FOLLOW UP:  °If any biopsies were taken you will be contacted by phone or by letter within the next 1-3 weeks. Call [phone]  if you have not heard about the biopsies in 3 weeks.  °Please also call with any specific questions about appointments or follow up tests.  °

## 2022-11-11 NOTE — Interval H&P Note (Signed)
History and Physical Interval Note:  11/11/2022 7:57 AM  Alexander [name]  has presented today for surgery, with the diagnosis of IDA, history of GAVE, gastric AVM, colonic AVMs.  The various methods of treatment have been discussed with the patient and family. After consideration of risks, benefits and other options for treatment, the patient has consented to  Procedure(s) with comments: ENTEROSCOPY (N/A) - push enteroscopy COLONOSCOPY WITH PROPOFOL (N/A) as a surgical intervention.  The patient's history has been reviewed, patient examined, no change in status, stable for surgery.  I have reviewed the patient's chart and labs.  Questions were answered to the patient's satisfaction.     Verlin Dike Bryton Romagnoli

## 2022-11-11 NOTE — Op Note (Signed)
[facility] Patient Name: Alexander [name] Procedure Date: 11/11/2022 MRN: [id number] Attending MD: Doristine Locks , MD, [id number] Date of Birth: Feb 27, 1951 CSN: [id number] Age: 72 Admit Type: Outpatient Procedure:                Colonoscopy Indications:              Iron deficiency anemia Providers:                Doristine Locks, MD, Fransisca Connors, Salley Scarlet, Technician, [facility], CRNA Referring MD:              Medicines:                Monitored Anesthesia Care Complications:            No immediate complications. Estimated Blood Loss:     Estimated blood loss was minimal. Procedure:                Pre-Anesthesia Assessment:                           - Prior to the procedure, a History and Physical                            was performed, and patient medications and                            allergies were reviewed. The patient's tolerance of                            previous anesthesia was also reviewed. The risks                            and benefits of the procedure and the sedation                            options and risks were discussed with the patient.                            All questions were answered, and informed consent                            was obtained. Prior Anticoagulants: The patient has                            taken no anticoagulant or antiplatelet agents. ASA                            Grade Assessment: III - A patient with severe                            systemic disease. After reviewing the risks and  benefits, the patient was deemed in satisfactory                            condition to undergo the procedure.                           After obtaining informed consent, the colonoscope                            was passed under direct vision. Throughout the                            procedure, the patient's blood pressure, pulse, and                             oxygen saturations were monitored continuously. The                            CF-HQ190L ([id number]) Olympus colonoscope was                            introduced through the anus and advanced to the the                            terminal ileum. The colonoscopy was performed                            without difficulty. The patient tolerated the                            procedure well. The quality of the bowel                            preparation was good. The terminal ileum, ileocecal                            valve, appendiceal orifice, and rectum were                            photographed. Scope In: 8:29:19 AM Scope Out: 8:52:43 AM Scope Withdrawal Time: 0 hours 19 minutes 47 seconds  Total Procedure Duration: 0 hours 23 minutes 24 seconds  Findings:      The perianal and digital rectal examinations were normal.      Five small angioectasias with typical arborization were found in the       descending colon (1) and in the transverse colon (4). Coagulation for       hemostasis using argon plasma was successful. Estimated blood loss was       minimal.      The exam was otherwise normal throughout the remainder of the colon.      The retroflexed view of the distal rectum and anal verge was normal and       showed no anal or rectal abnormalities.      The terminal ileum appeared normal. Impression:               -  Five colonic angioectasias. Treated with argon                            plasma coagulation (APC).                           - The distal rectum and anal verge are normal on                            retroflexion view.                           - The examined portion of the ileum was normal.                           - No specimens collected. Moderate Sedation:      Not Applicable - Patient had care per Anesthesia. Recommendation:           - Patient has a contact number available for                            emergencies. The signs and symptoms of potential                             delayed complications were discussed with the                            patient. Return to normal activities tomorrow.                            Written discharge instructions were provided to the                            patient.                           - Resume previous diet.                           - Continue present medications.                           - Repeat colonoscopy is not recommended due to                            current age (65 years or older) for screening                            purposes. If recurrence of IDA or bleeding,                            colonoscopy can certainly still be done for                            diagnostic and therapeutic purposes.                           -  Continue follow-up in the Hematology Clinic for                            iron deficiency anemia.                           - If recurrence of iron deficiency, could consider                            further small bowel interrogation with Video                            Capsule Endoscopy to evaluate for additional AVMs                            in the mid to distal small bowel. Procedure Code(s):        --- Professional ---                           [phone], Colonoscopy, flexible; with control of                            bleeding, any method Diagnosis Code(s):        --- Professional ---                           K55.20, Angiodysplasia of colon without hemorrhage                           D50.9, Iron deficiency anemia, unspecified CPT copyright 2022 American Medical Association. All rights reserved. The codes documented in this report are preliminary and upon coder review may  be revised to meet current compliance requirements. Doristine Locks, MD 11/11/2022 9:07:46 AM Number of Addenda: 0

## 2022-11-11 NOTE — Anesthesia Postprocedure Evaluation (Signed)
Anesthesia Post Note  Patient: Alexander [name]  Procedure(s) Performed: ENTEROSCOPY COLONOSCOPY WITH PROPOFOL HOT HEMOSTASIS (ARGON PLASMA COAGULATION/BICAP) BIOPSY     Patient location during evaluation: PACU Anesthesia Type: MAC Level of consciousness: awake and alert Pain management: pain level controlled Vital Signs Assessment: post-procedure vital signs reviewed and stable Respiratory status: spontaneous breathing Cardiovascular status: stable Anesthetic complications: no   No notable events documented.  Last Vitals:  Vitals:   11/11/22 0912 11/11/22 0922  BP: 135/68 (!) 159/69  Pulse: 76 75  Resp: 14 20  Temp:    SpO2: 97% 98%    Last Pain:  Vitals:   11/11/22 0922  TempSrc:   PainSc: 0-No pain                 Lewie Loron

## 2022-11-11 NOTE — H&P (Signed)
GASTROENTEROLOGY PROCEDURE H&P NOTE   Primary Care Physician: Bjorn Pippin, MD    Reason for Procedure:  Iron deficiency anemia, gastrointestinal AVMs, GAVE, NASH   Plan:    Push enteroscopy, colonoscopy   Patient is appropriate for endoscopic procedure(s) at [facility].  The nature of the procedure, as well as the risks, benefits, and alternatives were carefully and thoroughly reviewed with the patient. Ample time for discussion and questions allowed. The patient understood, was satisfied, and agreed to proceed.     HPI: Alexander [name] is a 72 y.o. 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  Past Medical History:  Diagnosis Date   Chest pain    a. 10.2006 Cath: nl cors, EF 65%;  b. 12/2008 Negative myoview.   DDD (degenerative disc disease), cervical    a. 08/2008 s/p R L4-5 diskectomy, foaminotomy, and lysis of adhesions.   Diabetes (HCC)    GERD (gastroesophageal reflux disease)    Gout    History of kidney stones    Hyperlipidemia    Hypertension    OSA (obstructive sleep apnea)    a. on cpap  NOT ABLE TO USE   Palpitations    a. 11/2008 - nl holter monitor;  b. 04/2010 Echo: EF 50-55%, Gr 1 DD.   Skin cancer    Sleep apnea    Stroke [facility])    a. 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    Past Surgical History:  Procedure Laterality Date   APPENDECTOMY     BACK SURGERY     BIOPSY  07/10/2021   Procedure: BIOPSY;  Surgeon: Benancio Deeds, MD;  Location: [facility] ENDOSCOPY;  Service: Gastroenterology;;   CATARACT EXTRACTION     RIGHT   COLONOSCOPY WITH PROPOFOL N/A 07/10/2021   Procedure: COLONOSCOPY WITH PROPOFOL;   Surgeon: Benancio Deeds, MD;  Location: [facility] ENDOSCOPY;  Service: Gastroenterology;  Laterality: N/A;   ESOPHAGOGASTRODUODENOSCOPY (EGD) WITH PROPOFOL N/A 07/10/2021   Procedure: ESOPHAGOGASTRODUODENOSCOPY (EGD) WITH PROPOFOL;  Surgeon: Benancio Deeds, MD;  Location: [facility] ENDOSCOPY;  Service: Gastroenterology;  Laterality: N/A;   FOOT SURGERY Right    HEMORROIDECTOMY     HOT HEMOSTASIS N/A 07/10/2021   Procedure: HOT HEMOSTASIS (ARGON PLASMA COAGULATION/BICAP);  Surgeon: Benancio Deeds, MD;  Location: [facility] ENDOSCOPY;  Service: Gastroenterology;  Laterality: N/A;   KNEE SURGERY     LEFT HEART CATHETERIZATION WITH CORONARY ANGIOGRAM N/A 07/18/2012   Procedure: LEFT HEART CATHETERIZATION WITH CORONARY ANGIOGRAM;  Surgeon: Kathleene Hazel, MD;  Location: [facility] CATH LAB;  Service: Cardiovascular;  Laterality: N/A;   LUMBAR LAMINECTOMY/DECOMPRESSION MICRODISCECTOMY Left 01/27/2016   Procedure: Left Lumbar Four-Five, Lumbar FIve-Sacral One Laminectomy/Foraminotomy;  Surgeon: Hilda Lias, MD;  Location: [facility] NEURO ORS;  Service: Neurosurgery;  Laterality: Left;  Left L4-5 L5-S1 Laminectomy/Foraminotomy   POLYPECTOMY  07/10/2021   Procedure: POLYPECTOMY;  Surgeon: Benancio Deeds, MD;  Location: [facility] ENDOSCOPY;  Service: Gastroenterology;;   RIGHT/LEFT HEART CATH AND CORONARY ANGIOGRAPHY N/A 07/25/2020   Procedure: RIGHT/LEFT HEART CATH AND CORONARY ANGIOGRAPHY;  Surgeon: Kathleene Hazel, MD;  Location: [facility] INVASIVE CV LAB;  Service: Cardiovascular;  Laterality: N/A;   skin cancer excision  05/2022   squamous  cell carcinoma, Left shoulder   TONSILLECTOMY      Prior to Admission medications   Medication Sig Start Date End Date Taking? Authorizing Provider  acetaminophen (TYLENOL) 500 MG tablet Take 1,000 mg by mouth every 6 (six) hours as needed for mild pain.   Yes [provider]  allopurinol (ZYLOPRIM) 300 MG tablet Take 150 mg by mouth at bedtime.   Yes [provider]  aspirin EC 81 MG tablet Take 81 mg by mouth every evening.   Yes [provider]  empagliflozin (JARDIANCE) 25 MG TABS tablet Take 25 mg by mouth daily.   Yes [provider]  fexofenadine (ALLEGRA) 180 MG tablet Take 180 mg by mouth daily as needed (allergies).   Yes [provider]  metoprolol succinate (TOPROL-XL) 50 MG 24 hr tablet Take 50 mg by mouth at bedtime. 02/10/19  Yes [provider]  ondansetron (ZOFRAN) 4 MG tablet Take 4 mg by mouth every 8 (eight) hours as needed for nausea or vomiting.   Yes [provider]  pantoprazole (PROTONIX) 40 MG tablet Take 40 mg by mouth 2 (two) times daily before a meal.   Yes [provider]    Current Facility-Administered Medications  Medication Dose Route Frequency Provider Last Rate Last Admin   0.9 %  sodium chloride infusion   Intravenous Continuous Jacobus Colvin V, DO        Allergies as of 08/13/2022 - Review Complete 08/09/2022  Allergen Reaction Noted   Contrast media [iodinated contrast media] Other (See Comments) 01/14/2016   Gadolinium Other (See Comments) 05/15/2010    Family History  Problem Relation Age of Onset   Stomach cancer Mother    Heart attack Father    Colon cancer Neg Hx    Pancreatic cancer Neg Hx    Esophageal cancer Neg Hx    Colon polyps Neg Hx     Social History   Socioeconomic History   Marital status: Married    Spouse name: Alona Bene   Number of children: 1   Years of education: Not on file   Highest education level: Not on file  Occupational History   Occupation: He owns a Museum/gallery exhibitions officer  Tobacco Use   Smoking status: Former    Types: Cigarettes    Quit date: 05/25/1983    Years since quitting: 39.4   Smokeless tobacco: Never   Tobacco comments:    quit smoking in 1986  Vaping Use   Vaping Use: Never used  Substance and Sexual Activity   Alcohol use: No    Comment: former - quit driking in 1983.   Drug use: No    Sexual activity: Not on file  Other Topics Concern   Not on file  Social History Narrative   Lives in [place], [place] with spouse.  Works as a Naval architect.   Social Determinants of Health   Financial Resource Strain: Not on file  Food Insecurity: Not on file  Transportation Needs: Not on file  Physical Activity: Not on file  Stress: Not on file  Social Connections: Not on file  Intimate Partner Violence: Not on file    Physical Exam: Vital signs in last 24 hours: @BP  (!) 148/55   Pulse 73   Temp 97.7 F (36.5 C) (Oral)   Resp 13   SpO2 99%  GEN: NAD EYE: Sclerae anicteric ENT: MMM CV: Non-tachycardic Pulm: CTA b/l GI: Soft, NT/ND NEURO:  Alert & Oriented x 3   Sony Schlarb,  DO [facility]   11/11/2022 7:55 AM

## 2022-11-12 LAB — SURGICAL PATHOLOGY

## 2022-11-15 ENCOUNTER — Encounter (HOSPITAL_COMMUNITY): Payer: Self-pay | Admitting: Gastroenterology

## 2023-01-05 NOTE — Progress Notes (Signed)
Cardiology Office Note:  .   Date:  01/18/2023  ID:  Alexander [name], DOB 21-Aug-1950, MRN [id number] PCP: Bjorn Pippin, MD  [facility] Providers Cardiologist:  Verne Carrow, MD    History of Present Illness: .   Alexander [name] is a 72 y.o. male   with history of diabetes mellitus, chest pain, gout, GERD, HTN, hyperlipidemia, sleep apnea on CPAP and prior stroke     He had a cardiac catheterization in 2006 with no evidence of CAD. Echo February 2014 with LVEF=55-60%, no valve disease.  Nuclear stress test at [facility] 07/18/20 with no evidence of ischemia. LVEF=65%. His former daughter-in-law works in our cath lab [facility] [name]). Cardiac cath 07/25/20 with mild non-obstructive CAD (20% proximal RCA stenosis, 20% mid Circumflex stenosis). LV systolic function normal by LV gram. Normal LV filling pressures. Echo 08/08/20 with LVEF=60-65%, moderate basal septal hypertrophy. No significant valve disease. He was found to be anemic by pre-cath lab testing.  Admitted with anemia 07/2021 nonbleeding AVM in stomach, vascular ectasia, Colon AVM's, tubular adenomatous polyps, abdominal US fatty disposition, splenomegaly-seeing liver specialist next week. Suspect cirrhosis secondary to fatty liver but normal LFT's.  Patient comes in with his wife. Just finished iron infusions. Denies chest pain, dyspnea, palpitations, edema. He didn't tolerate crestor. He works on his trucks all day. He does 4000-5000 steps daily.     ROS:    Studies Reviewed: Marland Kitchen         Prior CV Studies:   Echo 08/08/20:  1. No mural thrombus with Definity. Left ventricular ejection fraction,  by estimation, is 60 to 65%. The left ventricle has normal function. The  left ventricle has no regional wall motion abnormalities. There is  moderate asymmetric left ventricular  hypertrophy of the basal-septal segment. Left ventricular diastolic  parameters were normal.   2. Right ventricular systolic function is normal. The right  ventricular  size is normal.   3. The mitral valve is grossly normal. Trivial mitral valve  regurgitation.   4. The aortic valve is tricuspid. Aortic valve regurgitation is not  visualized. Mild aortic valve sclerosis is present, with no evidence of  aortic valve stenosis.    Cardiac cath 07/25/20: Prox RCA lesion is 20% stenosed. Mid Cx lesion is 20% stenosed. The left ventricular systolic function is normal. LV end diastolic pressure is normal. The left ventricular ejection fraction is greater than 65% by visual estimate. There is no mitral valve regurgitation.   1. Mild non-obstructive CAD 2. Normal right and left heart pressures 3. Normal LV systolic function      Risk Assessment/Calculations:             Physical Exam:   VS:  BP 122/62   Pulse 68   Ht 5\' 11"  (1.803 m)   Wt 241 lb 3.2 oz (109.4 kg)   SpO2 96%   BMI 33.64 kg/m    Wt Readings from Last 3 Encounters:  01/18/23 241 lb 3.2 oz (109.4 kg)  11/11/22 241 lb (109.3 kg)  10/28/22 241 lb (109.3 kg)    GEN: Obese, in no acute distress NECK: No JVD; No carotid bruits CARDIAC:  RRR, no murmurs, rubs, gallops RESPIRATORY:  Clear to auscultation without rales, wheezing or rhonchi  ABDOMEN: Soft, non-tender, non-distended EXTREMITIES:  No edema; No deformity   ASSESSMENT AND PLAN: .    Nonobstructive CAD on cath 07/2020, normal LVEF-no recent angina. Crestor stopped due to possible cirrhosis due to fatty liver.  History of chest pain felt to be noncardiac-no recent chest pain.   HTN well controlled on toprol   DM2 on jardiance   HLD-off crestor due to possible cirrhosis due to fatty liver-LDL 48 11/2022. Won't push statins. Hasn't tolerated in past   History of CVA   OSA on CPAP        Dispo: f/u in 12 months.  Signed, Jacolyn Reedy, PA-C

## 2023-01-18 ENCOUNTER — Ambulatory Visit: Payer: Medicare HMO | Admitting: Physician Assistant

## 2023-01-18 ENCOUNTER — Encounter: Payer: Self-pay | Admitting: Physician Assistant

## 2023-01-18 VITALS — BP 122/62 | HR 68 | Ht 71.0 in | Wt 241.2 lb

## 2023-01-18 DIAGNOSIS — I2510 Atherosclerotic heart disease of native coronary artery without angina pectoris: Secondary | ICD-10-CM

## 2023-01-18 DIAGNOSIS — E785 Hyperlipidemia, unspecified: Secondary | ICD-10-CM

## 2023-01-18 DIAGNOSIS — I10 Essential (primary) hypertension: Secondary | ICD-10-CM | POA: Diagnosis not present

## 2023-01-18 DIAGNOSIS — G4733 Obstructive sleep apnea (adult) (pediatric): Secondary | ICD-10-CM

## 2023-01-18 DIAGNOSIS — Z8673 Personal history of transient ischemic attack (TIA), and cerebral infarction without residual deficits: Secondary | ICD-10-CM

## 2023-01-18 DIAGNOSIS — R9431 Abnormal electrocardiogram [ECG] [EKG]: Secondary | ICD-10-CM

## 2023-01-18 DIAGNOSIS — R079 Chest pain, unspecified: Secondary | ICD-10-CM | POA: Diagnosis not present

## 2023-01-18 DIAGNOSIS — E119 Type 2 diabetes mellitus without complications: Secondary | ICD-10-CM | POA: Diagnosis not present

## 2023-01-18 NOTE — Patient Instructions (Signed)
Medication Instructions:   Your physician recommends that you continue on your current medications as directed. Please refer to the Current Medication list given to you today.  *If you need a refill on your cardiac medications before your next appointment, please call your pharmacy*    Follow-Up: At [facility], you and your health needs are our priority.  As part of our continuing mission to provide you with exceptional heart care, we have created designated Provider Care Teams.  These Care Teams include your primary Cardiologist (physician) and Advanced Practice Providers (APPs -  Physician Assistants and Nurse Practitioners) who all work together to provide you with the care you need, when you need it.  We recommend signing up for the patient portal called "MyChart".  Sign up information is provided on this After Visit Summary.  MyChart is used to connect with patients for Virtual Visits (Telemedicine).  Patients are able to view lab/test results, encounter notes, upcoming appointments, etc.  Non-urgent messages can be sent to your provider as well.   To learn more about what you can do with MyChart, go to [email or web address].    Your next appointment:   1 year(s)  Provider:   Verne Carrow, MD

## 2023-01-25 ENCOUNTER — Telehealth: Payer: Self-pay

## 2023-01-25 DIAGNOSIS — K7460 Unspecified cirrhosis of liver: Secondary | ICD-10-CM

## 2023-01-25 NOTE — Telephone Encounter (Signed)
-----   Message from [facility] Alastor Kneale B sent at 08/17/2022  9:07 AM EDT ----- Regarding: MRI Dr. Barron Alvine recommended MRI liver in 6 months for continued HCC screening.

## 2023-01-25 NOTE — Telephone Encounter (Signed)
Patient is scheduled for MRI on 02/16/23 at 1:00 PM at [facility]. [facility] with patient. Stated to speak his wife Alona Bene at [phone]. Called Alona Bene. Made her aware of appointment of MRI. Advised her to be at registration desk at [facility] hospital 30 minutes prior and nothing to eat or drink 4 hours prior the the test. Provided radiology contact no. if she needs to reschedule patient's appointment. Alona Bene verbalized understanding.

## 2023-02-16 ENCOUNTER — Ambulatory Visit (HOSPITAL_COMMUNITY)
Admission: RE | Admit: 2023-02-16 | Discharge: 2023-02-16 | Disposition: A | Discharge status: Home / Self Care | Payer: Medicare HMO | Source: Ambulatory Visit | Attending: Gastroenterology | Admitting: Gastroenterology

## 2023-02-16 ENCOUNTER — Encounter (HOSPITAL_COMMUNITY): Payer: Self-pay

## 2023-02-16 DIAGNOSIS — K7460 Unspecified cirrhosis of liver: Secondary | ICD-10-CM

## 2023-02-17 ENCOUNTER — Telehealth: Payer: Self-pay | Admitting: Gastroenterology

## 2023-02-17 DIAGNOSIS — K7460 Unspecified cirrhosis of liver: Secondary | ICD-10-CM

## 2023-02-17 DIAGNOSIS — K7581 Nonalcoholic steatohepatitis (NASH): Secondary | ICD-10-CM

## 2023-02-17 NOTE — Telephone Encounter (Signed)
===  View-only below this line=== ----- Message ----- From: Shellia Cleverly, DO Sent: 02/17/2023  12:11 PM EDT To: Richardson Chiquito, RN  In that case, I think we are better off doing RUQ ultrasound and AFP every 6 months instead for him.  Can you modify the order to RUQ ultrasound please.  Thanks.

## 2023-02-17 NOTE — Telephone Encounter (Signed)
Patient with historical gadolinium reaction notated in chart 2011- "Desc: unusual reaction, patient was nauseous, lost consciousness, Onset Date: [id number]"   Patient was scheduled for MR abdomen w/wo for [facility] screen but cancelled due to this allergy.  Test has been rescheduled as previously ordered. New appointment Monday 02/21/23 at 3:30 pm, 3:00 pm arrival, NPO 4 hr prior at [facility] Radiology  Dr C- Would you like me to send prednisone and benadryl premed for him to take before MRI or would MR without contrast be your preference?  Looks like he is also listed as having an allergy to iohexol (notes GI intolerance, stroke), CT with contrast would also require a premed.

## 2023-02-17 NOTE — Telephone Encounter (Signed)
MRCP order has been cancelled. RUQ ultrasound has been scheduled instead on 02/22/23 at 10:00 am, 9:30 am arrival at [facility]. This will be prior to his upcoming office appointment with Dr Barron Alvine on 02/22/23 at 1:40 pm.   I have left a message with a male identifying herself as Alexander [name]'s Diplomatic Services operational officer for patient to call back.

## 2023-02-17 NOTE — Telephone Encounter (Signed)
I have spoken to patient to advise that we will substitute his previous MRCP for ultrasound instead. Advised of u/s now scheduled at [facility] hospital 02/22/23 at 10 am, 930 am arrival, npo midnight. Patient verbalizes understanding of this information.

## 2023-02-17 NOTE — Telephone Encounter (Signed)
PT was scheduled for an MRI and could not proceed with it because he is allergic to contrast. He needs to have a script sent in that would give him something he would take the day before to prepare. Requesting call back.

## 2023-02-21 ENCOUNTER — Ambulatory Visit (HOSPITAL_COMMUNITY): Payer: Medicare HMO

## 2023-02-22 ENCOUNTER — Ambulatory Visit (HOSPITAL_COMMUNITY)
Admission: RE | Admit: 2023-02-22 | Discharge: 2023-02-22 | Disposition: A | Discharge status: Home / Self Care | Payer: Medicare HMO | Source: Ambulatory Visit | Attending: Gastroenterology | Admitting: Gastroenterology

## 2023-02-22 ENCOUNTER — Other Ambulatory Visit (INDEPENDENT_AMBULATORY_CARE_PROVIDER_SITE_OTHER): Payer: Medicare HMO

## 2023-02-22 ENCOUNTER — Encounter: Payer: Self-pay | Admitting: Gastroenterology

## 2023-02-22 ENCOUNTER — Ambulatory Visit (INDEPENDENT_AMBULATORY_CARE_PROVIDER_SITE_OTHER): Payer: Medicare HMO | Admitting: Gastroenterology

## 2023-02-22 VITALS — BP 130/80 | HR 77 | Ht 71.0 in | Wt 243.2 lb

## 2023-02-22 DIAGNOSIS — D509 Iron deficiency anemia, unspecified: Secondary | ICD-10-CM

## 2023-02-22 DIAGNOSIS — K5520 Angiodysplasia of colon without hemorrhage: Secondary | ICD-10-CM

## 2023-02-22 DIAGNOSIS — K7581 Nonalcoholic steatohepatitis (NASH): Secondary | ICD-10-CM

## 2023-02-22 DIAGNOSIS — G4733 Obstructive sleep apnea (adult) (pediatric): Secondary | ICD-10-CM

## 2023-02-22 DIAGNOSIS — K7460 Unspecified cirrhosis of liver: Secondary | ICD-10-CM

## 2023-02-22 DIAGNOSIS — R161 Splenomegaly, not elsewhere classified: Secondary | ICD-10-CM

## 2023-02-22 DIAGNOSIS — K3189 Other diseases of stomach and duodenum: Secondary | ICD-10-CM

## 2023-02-22 DIAGNOSIS — D696 Thrombocytopenia, unspecified: Secondary | ICD-10-CM

## 2023-02-22 DIAGNOSIS — Z860100 Personal history of colon polyps, unspecified: Secondary | ICD-10-CM

## 2023-02-22 DIAGNOSIS — K31819 Angiodysplasia of stomach and duodenum without bleeding: Secondary | ICD-10-CM

## 2023-02-22 DIAGNOSIS — K766 Portal hypertension: Secondary | ICD-10-CM

## 2023-02-22 LAB — CBC
HCT: 38.3 % — ABNORMAL LOW (ref 39.0–52.0)
Hemoglobin: 12 g/dL — ABNORMAL LOW (ref 13.0–17.0)
MCHC: 31.2 g/dL (ref 30.0–36.0)
MCV: 74.4 fL — ABNORMAL LOW (ref 78.0–100.0)
Platelets: 85 10*3/uL — ABNORMAL LOW (ref 150.0–400.0)
RBC: 5.15 Mil/uL (ref 4.22–5.81)
RDW: 20 % — ABNORMAL HIGH (ref 11.5–15.5)
WBC: 5.1 10*3/uL (ref 4.0–10.5)

## 2023-02-22 LAB — IBC + FERRITIN
Ferritin: 4.1 ng/mL — ABNORMAL LOW (ref 22.0–322.0)
Iron: 30 ug/dL — ABNORMAL LOW (ref 42–165)
Saturation Ratios: 6.7 % — ABNORMAL LOW (ref 20.0–50.0)
TIBC: 450.8 ug/dL — ABNORMAL HIGH (ref 250.0–450.0)
Transferrin: 322 mg/dL (ref 212.0–360.0)

## 2023-02-22 LAB — AMMONIA: Ammonia: 52 umol/L — ABNORMAL HIGH (ref 11–35)

## 2023-02-22 NOTE — Progress Notes (Unsigned)
Chief Complaint:    Hospital follow-up  GI History: 72 year old male with history of HTN, HLD, mild nonobstructive CAD with normal LV function, CVA 2011, OSA (intolerant to CPAP), diabetes, nephrolithiasis, GERD, thrombocytopenia, anemia.  Hospital admission 07/08/2021 for symptomatic IDA, treated with IV iron, high-dose PPI.   - 07/10/2021: EGD: Single nonbleeding AVM in stomach treated with APC, mild GAVE treated with APC.  Gastric biopsies negative for H. pylori - 07/10/2021: Colonoscopy: Several colonic AVMs treated with APC, two 3 mm tubular adenomas removed from ascending colon, 4 mm adenoma removed from rectum.  Solitary rectal ulcer - 07/11/2021: Abdominal ultrasound: Diffusely coarsened hepatic echotexture, splenomegaly with patent portal vein - 08/20/2021: H/H 12.7/38.6, PLT 81.  AST 48, otherwise normal liver enzymes. Normal/negative A1 AT, ANA, AMA, ASMA, HCV, HBV (needs hepatitis B vaccine), HAV Ab+.  FIB-4 5.96, NASH FibroSURE fibrosis score 0.54 (F2, bridging fibrosis with few septa), steatosis score 0.8 (S3, marked or severe steatosis), NASH score 0.75 (N2, definite NASH) -09/15/2021: Ultrasound elastography: Hepatic steatosis on ultrasound portion but normal elastography (median kPa 5.6), essentially ruling out compensated advanced chronic liver disease  - 11/11/2022: Enteroscopy: Moderate GAVE treated with APC, moderate portal hypertensive gastropathy, normal esophagus and small bowel - 11/11/2022: Colonoscopy: 5 AVMs in the descending/transverse colon treated with APC, otherwise normal - IV iron - 02/22/2023: RUQ [place]: Cirrhotic appearing liver.  Negative for [facility]  HPI:     Patient is a 72 y.o. male presenting to the Gastroenterology Clinic for follow-up.  Was last seen by me on 08/09/2022.  Was doing well at that time.  Labs were notable for IDA so he was scheduled for enteroscopy/colonoscopy, completed 11/11/2022.  Imbalance for couple mins in AM. Thinks from sleep issue. Not wearing  CPAP regularly.   RUQ [place] completed earlier today and notable for cirrhosis but no mass.    Review of systems:     No chest pain, no SOB, no fevers, no urinary sx   Past Medical History:  Diagnosis Date   Chest pain    a. 10.2006 Cath: nl cors, EF 65%;  b. 12/2008 Negative myoview.   DDD (degenerative disc disease), cervical    a. 08/2008 s/p R L4-5 diskectomy, foaminotomy, and lysis of adhesions.   Diabetes (HCC)    GERD (gastroesophageal reflux disease)    Gout    History of kidney stones    Hyperlipidemia    Hypertension    OSA (obstructive sleep apnea)    a. on cpap  NOT ABLE TO USE   Palpitations    a. 11/2008 - nl holter monitor;  b. 04/2010 Echo: EF 50-55%, Gr 1 DD.   Skin cancer    Sleep apnea    Stroke [facility])    a. 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    Patient's surgical history, family medical history, social history, medications and allergies were all reviewed in Epic    Current Outpatient Medications  Medication Sig Dispense Refill   allopurinol (ZYLOPRIM) 300 MG tablet Take 150 mg by mouth at bedtime.     aspirin EC 81 MG tablet Take 81 mg by mouth every evening.     empagliflozin (JARDIANCE) 25 MG TABS tablet Take 25 mg by mouth daily.     metoprolol succinate (TOPROL-XL) 50 MG 24 hr tablet Take 50 mg by mouth at bedtime.     Multiple Vitamins-Minerals (CENTRUM SILVER PO) Take 1 tablet by mouth daily.     pantoprazole (PROTONIX) 40 MG tablet Take 40 mg by  mouth 2 (two) times daily before a meal.     acetaminophen (TYLENOL) 500 MG tablet Take 1,000 mg by mouth every 6 (six) hours as needed for mild pain. (Patient not taking: Reported on 02/22/2023)     fexofenadine (ALLEGRA) 180 MG tablet Take 180 mg by mouth daily as needed (allergies). (Patient not taking: Reported on 02/22/2023)     ondansetron (ZOFRAN) 4 MG tablet Take 4 mg by mouth every 8 (eight) hours as needed for nausea or vomiting. (Patient not taking: Reported on 02/22/2023)     No current  facility-administered medications for this visit.    Physical Exam:     BP 130/80   Pulse 77   Ht 5\' 11"  (1.803 m)   Wt 243 lb 4 oz (110.3 kg)   SpO2 98%   BMI 33.93 kg/m   GENERAL:  Pleasant *** in NAD PSYCH: : Cooperative, normal affect EENT:  conjunctiva pink, mucous membranes moist, neck supple without masses CARDIAC:  RRR, ***murmur heard, no peripheral edema PULM: Normal respiratory effort, lungs CTA bilaterally, no wheezing ABDOMEN:  Nondistended, soft, nontender. No obvious masses, no hepatomegaly,  normal bowel sounds SKIN:  turgor, no lesions seen Musculoskeletal:  Normal muscle tone, normal strength NEURO: Alert and oriented x 3, no focal neurologic deficits   IMPRESSION and PLAN:    1)   -Repeat CBC, iron panel today - Check ammonia - Check Afp - Repeat EGD/Colo prn bleeding, anemia, etc   RTC in 6 months or sooner prn            Shellia Cleverly ,DO, FACG 02/22/2023, 2:23 PM

## 2023-02-22 NOTE — Patient Instructions (Addendum)
_______________________________________________________  If your blood pressure at your visit was 140/90 or greater, please contact your primary care physician to follow up on this.  _______________________________________________________  If you are age 73 or older, your body mass index should be between 23-30. Your Body mass index is 33.93 kg/m. If this is out of the aforementioned range listed, please consider follow up with your Primary Care Provider.  If you are age 8 or younger, your body mass index should be between 19-25. Your Body mass index is 33.93 kg/m. If this is out of the aformentioned range listed, please consider follow up with your Primary Care Provider.   ________________________________________________________  The [facility] providers would like to encourage you to use [facility] to communicate with providers for non-urgent requests or questions.  Due to long hold times on the telephone, sending your provider a message by [facility] may be a faster and more efficient way to get a response.  Please allow 48 business hours for a response.  Please remember that this is for non-urgent requests.  _______________________________________________________  Your provider has requested that you go to the basement level for lab work before leaving today. Press "B" on the elevator. The lab is located at the first door on the left as you exit the elevator.  Please follow up in 6 months. Give [place] a call at [phone] to schedule an appointment.  It was a pleasure to see you today!  Vito Cirigliano, D.O.

## 2023-02-23 ENCOUNTER — Other Ambulatory Visit: Payer: Self-pay | Admitting: *Deleted

## 2023-02-23 DIAGNOSIS — D509 Iron deficiency anemia, unspecified: Secondary | ICD-10-CM

## 2023-02-23 MED ORDER — LACTULOSE 10 GM/15ML PO SOLN: 30.0000 g | Freq: Every day | ORAL | 1 refills | Status: DC

## 2023-02-24 LAB — AFP TUMOR MARKER: AFP-Tumor Marker: 2.3 ng/mL (ref <6.1)

## 2023-03-01 ENCOUNTER — Other Ambulatory Visit: Payer: Self-pay | Admitting: *Deleted

## 2023-03-01 DIAGNOSIS — K7460 Unspecified cirrhosis of liver: Secondary | ICD-10-CM

## 2023-03-09 ENCOUNTER — Inpatient Hospital Stay: Payer: Medicare HMO

## 2023-03-09 ENCOUNTER — Inpatient Hospital Stay: Payer: Medicare HMO | Admitting: Oncology

## 2023-03-11 ENCOUNTER — Inpatient Hospital Stay: Payer: Medicare HMO | Attending: Oncology

## 2023-03-11 ENCOUNTER — Encounter: Payer: Self-pay | Admitting: Oncology

## 2023-03-11 ENCOUNTER — Telehealth: Payer: Self-pay

## 2023-03-11 ENCOUNTER — Inpatient Hospital Stay: Payer: Medicare HMO | Admitting: Oncology

## 2023-03-11 VITALS — BP 156/72 | HR 63 | Temp 97.8°F | Resp 13 | Wt 242.6 lb

## 2023-03-11 DIAGNOSIS — K31819 Angiodysplasia of stomach and duodenum without bleeding: Secondary | ICD-10-CM | POA: Insufficient documentation

## 2023-03-11 DIAGNOSIS — D500 Iron deficiency anemia secondary to blood loss (chronic): Secondary | ICD-10-CM

## 2023-03-11 DIAGNOSIS — D696 Thrombocytopenia, unspecified: Secondary | ICD-10-CM

## 2023-03-11 DIAGNOSIS — Z87891 Personal history of nicotine dependence: Secondary | ICD-10-CM | POA: Diagnosis not present

## 2023-03-11 DIAGNOSIS — K5520 Angiodysplasia of colon without hemorrhage: Secondary | ICD-10-CM | POA: Diagnosis not present

## 2023-03-11 LAB — CBC WITH DIFFERENTIAL/PLATELET
Abs Immature Granulocytes: 0.01 10*3/uL (ref 0.00–0.07)
Basophils Absolute: 0.1 10*3/uL (ref 0.0–0.1)
Basophils Relative: 1 %
Eosinophils Absolute: 0.1 10*3/uL (ref 0.0–0.5)
Eosinophils Relative: 2 %
HCT: 38.7 % — ABNORMAL LOW (ref 39.0–52.0)
Hemoglobin: 11.8 g/dL — ABNORMAL LOW (ref 13.0–17.0)
Immature Granulocytes: 0 %
Lymphocytes Relative: 21 %
Lymphs Abs: 1 10*3/uL (ref 0.7–4.0)
MCH: 23 pg — ABNORMAL LOW (ref 26.0–34.0)
MCHC: 30.5 g/dL (ref 30.0–36.0)
MCV: 75.4 fL — ABNORMAL LOW (ref 80.0–100.0)
Monocytes Absolute: 0.5 10*3/uL (ref 0.1–1.0)
Monocytes Relative: 10 %
Neutro Abs: 3.2 10*3/uL (ref 1.7–7.7)
Neutrophils Relative %: 66 %
Platelets: 84 10*3/uL — ABNORMAL LOW (ref 150–400)
RBC: 5.13 MIL/uL (ref 4.22–5.81)
RDW: 19.1 % — ABNORMAL HIGH (ref 11.5–15.5)
WBC: 4.8 10*3/uL (ref 4.0–10.5)
nRBC: 0 % (ref 0.0–0.2)

## 2023-03-11 LAB — COMPREHENSIVE METABOLIC PANEL
ALT: 34 U/L (ref 0–44)
AST: 39 U/L (ref 15–41)
Albumin: 4.4 g/dL (ref 3.5–5.0)
Alkaline Phosphatase: 62 U/L (ref 38–126)
Anion gap: 8 (ref 5–15)
BUN: 17 mg/dL (ref 8–23)
CO2: 24 mmol/L (ref 22–32)
Calcium: 9.8 mg/dL (ref 8.9–10.3)
Chloride: 108 mmol/L (ref 98–111)
Creatinine, Ser: 1.13 mg/dL (ref 0.61–1.24)
GFR, Estimated: >60 mL/min (ref >60)
Glucose, Bld: 123 mg/dL — ABNORMAL HIGH (ref 70–99)
Potassium: 3.9 mmol/L (ref 3.5–5.1)
Sodium: 140 mmol/L (ref 135–145)
Total Bilirubin: 0.8 mg/dL (ref 0.3–1.2)
Total Protein: 8.1 g/dL (ref 6.5–8.1)

## 2023-03-11 LAB — IRON AND IRON BINDING CAPACITY (CC-WL,HP ONLY)
Iron: 31 ug/dL — ABNORMAL LOW (ref 45–182)
Saturation Ratios: 7 % — ABNORMAL LOW (ref 17.9–39.5)
TIBC: 472 ug/dL — ABNORMAL HIGH (ref 250–450)
UIBC: 441 ug/dL — ABNORMAL HIGH (ref 117–376)

## 2023-03-11 LAB — VITAMIN B12: Vitamin B-12: 627 pg/mL (ref 180–914)

## 2023-03-11 LAB — FERRITIN: Ferritin: 4 ng/mL — ABNORMAL LOW (ref 24–336)

## 2023-03-11 LAB — FOLATE: Folate: 25 ng/mL (ref >5.9)

## 2023-03-11 NOTE — Assessment & Plan Note (Addendum)
History of iron infusions in April 2024 with improvement in fatigue. Current hemoglobin of 12. No overt blood loss, but history of gastric angiodysplasia which can cause microscopic bleeding. -Hemoglobin remains decreased at 11.8 today.  Iron studies continue to show evidence of iron deficiency. -He seems to have iron deficiency anemia from microscopic blood loss from his AVMs/GAVE.  Hence we will arrange for IV iron infusion with Venofer 200 mg weekly x 5 doses. -Since the cause of anemia seems to be obvious from iron deficiency, I am not pursuing extensive workup at this time.  If inadequate response to IV iron is noted, we will pursue workup to rule out other etiologies. -Follow-up in 3 months to reassess hemoglobin and iron levels.

## 2023-03-11 NOTE — Telephone Encounter (Signed)
Auth Submission: NO AUTH NEEDED Site of care: Site of care: [facility] Payer: [employer] Medication & CPT/J Code(s) submitted: Venofer (Iron Sucrose) J1756 Route of submission (phone, fax, portal):  Phone # Fax # Auth type: Buy/Bill PB Units/visits requested: 200mg  x 5 doses Reference number:  Approval from: 03/11/23 to 05/24/23

## 2023-03-11 NOTE — Assessment & Plan Note (Signed)
-  Chronic, stable thrombocytopenia, likely from splenomegaly which is related to his cirrhotic liver morphology from [facility]. -No additional intervention is needed at current levels.  Continue to monitor.

## 2023-03-11 NOTE — Assessment & Plan Note (Signed)
-   Has close follow-up with his gastroenterologist.  No active bleeding issues currently.

## 2023-03-11 NOTE — Progress Notes (Signed)
[facility]  HEMATOLOGY CLINIC CONSULTATION NOTE    PATIENT NAME: Alexander [name]   MR#: [id number] DOB: April 20, 1951  DATE OF SERVICE: 03/11/2023  Patient Care Team: Bjorn Pippin, MD as PCP - General (Neurology) Kathleene Hazel, MD as PCP - Cardiology (Cardiology)  REASON FOR CONSULTATION/ CHIEF COMPLAINT:  Evaluation of anemia.  HISTORY OF PRESENT ILLNESS:  Alexander [name] is a 72 y.o. gentleman with a past medical history of portal hypertensive gastropathy, splenomegaly, MASH, GAVE, gastric and colonic AVMs, mild nonobstructive CAD, hypertension, dyslipidemia, CVA in 2011, OSA (intolerant to CPAP), diabetes, nephrolithiasis, GERD,  was referred to our service for evaluation of iron deficiency anemia.    Discussed the use of AI scribe software for clinical note transcription with the patient, who gave verbal consent to proceed.   He received iron infusions in April 2024 as recommended by his gastroenterologist.  It helped improve his energy levels. However, he reports persistent fatigue, particularly in the evenings. He also reports difficulty sleeping, which he attributes to his diabetes medication. He experiences occasional shortness of breath and dizziness upon getting out of bed, but denies any chest pain. He denies any visible blood loss, but notes a change in bowel habits, with less frequent but larger bowel movements. He has a history of GAVE/ AVMs which has been cauterized four times.  On 02/22/2023, labs at his gastroenterologist office showed hemoglobin of 12, MCV 74.4, hematocrit 38.3, white count 5100, platelet count 85,000.  Iron studies showed evidence of iron deficiency with iron decreased at 30, ferritin 4.1, iron saturation 6.7%.  Given persistent iron deficiency anemia, he was referred to [place] for evaluation for IV iron.  MEDICAL HISTORY:  Past Medical History:  Diagnosis Date   Chest pain    a. 10.2006 Cath: nl cors, EF 65%;  b. 12/2008 Negative  myoview.   DDD (degenerative disc disease), cervical    a. 08/2008 s/p R L4-5 diskectomy, foaminotomy, and lysis of adhesions.   Diabetes (HCC)    GERD (gastroesophageal reflux disease)    Gout    History of kidney stones    Hyperlipidemia    Hypertension    OSA (obstructive sleep apnea)    a. on cpap  NOT ABLE TO USE   Palpitations    a. 11/2008 - nl holter monitor;  b. 04/2010 Echo: EF 50-55%, Gr 1 DD.   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 Added automatically from request for surgery [phone] Formatting of this note might be different from the original. Added automatically from request for surgery [id number]     Precordial pain 07/17/2012   Skin cancer    Sleep apnea    Stroke [facility])    a. 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    SURGICAL HISTORY: Past Surgical History:  Procedure Laterality Date   APPENDECTOMY     BACK SURGERY     BIOPSY  07/10/2021   Procedure: BIOPSY;  Surgeon: Benancio Deeds, MD;  Location: [facility] ENDOSCOPY;  Service: Gastroenterology;;   BIOPSY  11/11/2022   Procedure: BIOPSY;  Surgeon: Shellia Cleverly, DO;  Location: [facility] ENDOSCOPY;  Service: Gastroenterology;;   CATARACT EXTRACTION     RIGHT   COLONOSCOPY WITH PROPOFOL N/A 07/10/2021   Procedure: COLONOSCOPY WITH PROPOFOL;  Surgeon: Benancio Deeds, MD;  Location: [facility] ENDOSCOPY;  Service: Gastroenterology;  Laterality: N/A;   COLONOSCOPY WITH PROPOFOL N/A 11/11/2022  Procedure: COLONOSCOPY WITH PROPOFOL;  Surgeon: Shellia Cleverly, DO;  Location: [facility] ENDOSCOPY;  Service: Gastroenterology;  Laterality: N/A;   ENTEROSCOPY N/A 11/11/2022   Procedure: ENTEROSCOPY;  Surgeon: Shellia Cleverly, DO;  Location: [facility] ENDOSCOPY;  Service: Gastroenterology;  Laterality: N/A;  push enteroscopy   ESOPHAGOGASTRODUODENOSCOPY (EGD) WITH PROPOFOL N/A 07/10/2021   Procedure: ESOPHAGOGASTRODUODENOSCOPY (EGD) WITH PROPOFOL;   Surgeon: Benancio Deeds, MD;  Location: [facility] ENDOSCOPY;  Service: Gastroenterology;  Laterality: N/A;   FOOT SURGERY Right    HEMORROIDECTOMY     HOT HEMOSTASIS N/A 07/10/2021   Procedure: HOT HEMOSTASIS (ARGON PLASMA COAGULATION/BICAP);  Surgeon: Benancio Deeds, MD;  Location: [facility] ENDOSCOPY;  Service: Gastroenterology;  Laterality: N/A;   HOT HEMOSTASIS N/A 11/11/2022   Procedure: HOT HEMOSTASIS (ARGON PLASMA COAGULATION/BICAP);  Surgeon: Shellia Cleverly, DO;  Location: [facility] ENDOSCOPY;  Service: Gastroenterology;  Laterality: N/A;   KNEE SURGERY     LEFT HEART CATHETERIZATION WITH CORONARY ANGIOGRAM N/A 07/18/2012   Procedure: LEFT HEART CATHETERIZATION WITH CORONARY ANGIOGRAM;  Surgeon: Kathleene Hazel, MD;  Location: [facility] CATH LAB;  Service: Cardiovascular;  Laterality: N/A;   LUMBAR LAMINECTOMY/DECOMPRESSION MICRODISCECTOMY Left 01/27/2016   Procedure: Left Lumbar Four-Five, Lumbar FIve-Sacral One Laminectomy/Foraminotomy;  Surgeon: Hilda Lias, MD;  Location: [facility] NEURO ORS;  Service: Neurosurgery;  Laterality: Left;  Left L4-5 L5-S1 Laminectomy/Foraminotomy   POLYPECTOMY  07/10/2021   Procedure: POLYPECTOMY;  Surgeon: Benancio Deeds, MD;  Location: [facility] ENDOSCOPY;  Service: Gastroenterology;;   RIGHT/LEFT HEART CATH AND CORONARY ANGIOGRAPHY N/A 07/25/2020   Procedure: RIGHT/LEFT HEART CATH AND CORONARY ANGIOGRAPHY;  Surgeon: Kathleene Hazel, MD;  Location: [facility] INVASIVE CV LAB;  Service: Cardiovascular;  Laterality: N/A;   skin cancer excision  05/2022   squamous cell carcinoma, Left shoulder   TONSILLECTOMY      SOCIAL HISTORY: He reports that he quit smoking about 39 years ago. His smoking use included cigarettes. He has never used smokeless tobacco. He reports that he does not drink alcohol and does not use drugs. Social History   Socioeconomic History   Marital status: Married    Spouse name: Alexander [name]   Number of children: 1   Years of education: Not on  file   Highest education level: Not on file  Occupational History   Occupation: He owns a Museum/gallery exhibitions officer  Tobacco Use   Smoking status: Former    Current packs/day: 0.00    Types: Cigarettes    Quit date: 05/25/1983    Years since quitting: 39.8   Smokeless tobacco: Never   Tobacco comments:    quit smoking in 1986  Vaping Use   Vaping status: Never Used  Substance and Sexual Activity   Alcohol use: No    Comment: former - quit driking in 1983.   Drug use: No   Sexual activity: Not on file  Other Topics Concern   Not on file  Social History Narrative   Lives in [place], [place] with spouse.  Works as a Naval architect.   Social Determinants of Health   Financial Resource Strain: Not on file  Food Insecurity: Not on file  Transportation Needs: Not on file  Physical Activity: Not on file  Stress: Not on file  Social Connections: Not on file  Intimate Partner Violence: Not on file    FAMILY HISTORY: Family History  Problem Relation Age of Onset   Stomach cancer Mother    Heart attack Father    Colon cancer Neg Hx    Pancreatic  cancer Neg Hx    Esophageal cancer Neg Hx    Colon polyps Neg Hx     ALLERGIES:  He is allergic to iodinated contrast media and gadolinium.  MEDICATIONS:  Current Outpatient Medications  Medication Sig Dispense Refill   acetaminophen (TYLENOL) 500 MG tablet Take 1,000 mg by mouth every 6 (six) hours as needed for mild pain (pain score 1-3).     allopurinol (ZYLOPRIM) 300 MG tablet Take 150 mg by mouth at bedtime.     aspirin EC 81 MG tablet Take 81 mg by mouth every evening.     empagliflozin (JARDIANCE) 25 MG TABS tablet Take 25 mg by mouth daily.     fexofenadine (ALLEGRA) 180 MG tablet Take 180 mg by mouth daily as needed (allergies).     lactulose (CHRONULAC) 10 GM/15ML solution Take 45 mLs (30 g total) by mouth daily. 1350 mL 1   metoprolol succinate (TOPROL-XL) 50 MG 24 hr tablet Take 50 mg by mouth at bedtime.     Multiple Vitamins-Minerals  (CENTRUM SILVER PO) Take 1 tablet by mouth daily.     ondansetron (ZOFRAN) 4 MG tablet Take 4 mg by mouth every 8 (eight) hours as needed for nausea or vomiting.     pantoprazole (PROTONIX) 40 MG tablet Take 40 mg by mouth 2 (two) times daily before a meal.     No current facility-administered medications for this visit.    REVIEW OF SYSTEMS:    Review of Systems - Oncology  All other pertinent systems were reviewed and were negative except as mentioned above.  PHYSICAL EXAMINATION:  ECOG PERFORMANCE STATUS: 1 - Symptomatic but completely ambulatory  Vitals:   03/11/23 1403  BP: (!) 156/72  Pulse: 63  Resp: 13  Temp: 97.8 F (36.6 C)  SpO2: 99%   Filed Weights   03/11/23 1403  Weight: 242 lb 9.6 oz (110 kg)    Physical Exam Constitutional:      General: He is not in acute distress.    Appearance: Normal appearance.  HENT:     Head: Normocephalic and atraumatic.  Eyes:     General: No scleral icterus.    Extraocular Movements: Extraocular movements intact.     Conjunctiva/sclera: Conjunctivae normal.  Cardiovascular:     Rate and Rhythm: Normal rate and regular rhythm.     Pulses: Normal pulses.     Heart sounds: Normal heart sounds.  Pulmonary:     Effort: Pulmonary effort is normal.     Breath sounds: Normal breath sounds.  Abdominal:     General: There is no distension.  Musculoskeletal:     Right lower leg: No edema.     Left lower leg: No edema.  Lymphadenopathy:     Cervical: No cervical adenopathy.  Skin:    Findings: No rash.  Neurological:     General: No focal deficit present.     Mental Status: He is alert and oriented to person, place, and time.  Psychiatric:        Mood and Affect: Mood normal.        Behavior: Behavior normal.        Thought Content: Thought content normal.        Judgment: Judgment normal.     LABORATORY DATA:   I have reviewed the data as listed.  Results for orders placed or performed in visit on 03/11/23   Comprehensive metabolic panel  Result Value Ref Range   Sodium 140 135 - 145 mmol/L  Potassium 3.9 3.5 - 5.1 mmol/L   Chloride 108 98 - 111 mmol/L   CO2 24 22 - 32 mmol/L   Glucose, Bld 123 (H) 70 - 99 mg/dL   BUN 17 8 - 23 mg/dL   Creatinine, Ser 6.96 0.61 - 1.24 mg/dL   Calcium 9.8 8.9 - 29.5 mg/dL   Total Protein 8.1 6.5 - 8.1 g/dL   Albumin 4.4 3.5 - 5.0 g/dL   AST 39 15 - 41 U/L   ALT 34 0 - 44 U/L   Alkaline Phosphatase 62 38 - 126 U/L   Total Bilirubin 0.8 0.3 - 1.2 mg/dL   GFR, Estimated >28 >41 mL/min   Anion gap 8 5 - 15  Iron and Iron Binding Capacity ([facility] only)  Result Value Ref Range   Iron 31 (L) 45 - 182 ug/dL   TIBC 324 (H) 401 - 027 ug/dL   Saturation Ratios 7 (L) 17.9 - 39.5 %   UIBC 441 (H) 117 - 376 ug/dL     Lab Results  Component Value Date   WBC 5.1 02/22/2023   NEUTROABS 3.3 09/30/2021   HGB 12.0 (L) 02/22/2023   HCT 38.3 (L) 02/22/2023   MCV 74.4 (L) 02/22/2023   PLT 85.0 (L) 02/22/2023    Lab Results  Component Value Date   IRON 31 (L) 03/11/2023   TIBC 472 (H) 03/11/2023   FERRITIN 4.1 (L) 02/22/2023     RADIOGRAPHIC STUDIES:  I have personally reviewed the radiological images as listed and agree with the findings in the report.  US Abdomen Limited RUQ (LIVER/GB)  Result Date: 02/22/2023 CLINICAL DATA:  HCC screening EXAM: ULTRASOUND ABDOMEN LIMITED RIGHT UPPER QUADRANT COMPARISON:  Ultrasound abdomen 08/16/2022 FINDINGS: Gallbladder: No gallstones or wall thickening visualized. No sonographic Murphy sign noted by sonographer. Common bile duct: Diameter: 2.7 mm Liver: Coarsened echogenicity and nodular contour. No focal lesion. Portal vein is patent on color Doppler imaging with normal direction of blood flow towards the liver. Other: None. IMPRESSION: Cirrhotic morphology of the liver. No focal lesion. Electronically Signed   By: Annia Belt M.D.   On: 02/22/2023 10:54     ASSESSMENT & PLAN:   72 y.o. gentleman with a past  medical history of portal hypertensive gastropathy, splenomegaly, MASH, GAVE, gastric and colonic AVMs, mild nonobstructive CAD, hypertension, dyslipidemia, CVA in 2011, OSA (intolerant to CPAP), diabetes, nephrolithiasis, GERD,  was referred to our service for evaluation of iron deficiency anemia.    Iron deficiency anemia secondary to blood loss (chronic) History of iron infusions in April 2024 with improvement in fatigue. Current hemoglobin of 12. No overt blood loss, but history of gastric angiodysplasia which can cause microscopic bleeding. -Today we checked repeat labs including iron studies, B12 and folate. -He seems to have iron deficiency anemia from microscopic blood loss from his AVMs/GAVE.  Hence we will arrange for IV iron infusion with Venofer 200 mg weekly x 5 doses. -Since the cause of anemia seems to be obvious from iron deficiency, I am not pursuing extensive workup at this time.  If inadequate response to IV iron is noted, we will pursue workup to rule out other etiologies. -Follow-up in 3 months to reassess hemoglobin and iron levels.   Thrombocytopenia (HCC) -Chronic, stable thrombocytopenia, likely from splenomegaly which is related to his cirrhotic liver morphology from [facility]. -No additional intervention is needed at current levels.  Continue to monitor.  GAVE (gastric antral vascular ectasia) - Has close follow-up with his gastroenterologist.  No active bleeding issues currently.   Orders Placed This Encounter  Procedures   CBC with Differential/Platelet    Standing Status:   Future    Number of Occurrences:   1    Standing Expiration Date:   03/10/2024   Comprehensive metabolic panel    Standing Status:   Future    Number of Occurrences:   1    Standing Expiration Date:   03/10/2024   Iron and Iron Binding Capacity ([facility] only)    Standing Status:   Future    Number of Occurrences:   1    Standing Expiration Date:   03/10/2024   Ferritin    Standing Status:    Future    Number of Occurrences:   1    Standing Expiration Date:   03/10/2024   Folate    Standing Status:   Future    Number of Occurrences:   1    Standing Expiration Date:   03/10/2024   Vitamin B12    Standing Status:   Future    Number of Occurrences:   1    Standing Expiration Date:   03/10/2024     The total time spent in the appointment was 50 minutes encounter with patients including review of chart and various tests results, discussions about plan of care and coordination of care plan.  I reviewed lab results and outside records for this visit and discussed relevant results with the patient. Diagnosis, plan of care and treatment options were also discussed in detail with the patient. Opportunity provided to ask questions and answers provided to his apparent satisfaction. Provided instructions to call our clinic with any problems, questions or concerns prior to return visit. I recommended to continue follow-up with PCP and sub-specialists. He verbalized understanding and agreed with the plan. No barriers to learning was detected.   Meryl Crutch, MD [facility] [address] [place] [address] Dept: [phone] Dept Fax: [phone]  03/11/2023 3:29 PM   This document was completed utilizing speech recognition software. Grammatical errors, random word insertions, pronoun errors, and incomplete sentences are an occasional consequence of this system due to software limitations, ambient noise, and hardware issues. Any formal questions or concerns about the content, text or information contained within the body of this dictation should be directly addressed to the provider for clarification.

## 2023-03-24 ENCOUNTER — Ambulatory Visit: Payer: Medicare HMO

## 2023-03-24 VITALS — BP 155/73 | HR 71 | Temp 97.8°F | Resp 16 | Ht 71.0 in | Wt 242.2 lb

## 2023-03-24 DIAGNOSIS — D500 Iron deficiency anemia secondary to blood loss (chronic): Secondary | ICD-10-CM | POA: Diagnosis not present

## 2023-03-24 DIAGNOSIS — K31819 Angiodysplasia of stomach and duodenum without bleeding: Secondary | ICD-10-CM

## 2023-03-24 MED ORDER — ACETAMINOPHEN 325 MG PO TABS
650.0000 mg | ORAL_TABLET | Freq: Once | ORAL | Status: DC
  Filled 2023-03-24: qty 2

## 2023-03-24 MED ORDER — DIPHENHYDRAMINE HCL 25 MG PO CAPS
25.0000 mg | ORAL_CAPSULE | Freq: Once | ORAL | Status: DC
  Filled 2023-03-24: qty 1

## 2023-03-24 MED ORDER — IRON SUCROSE 20 MG/ML IV SOLN
200.0000 mg | Freq: Once | INTRAVENOUS | Status: AC
  Administered 2023-03-24: 200 mg via INTRAVENOUS
  Filled 2023-03-24: qty 10

## 2023-03-24 NOTE — Progress Notes (Signed)
Diagnosis: Iron Deficiency Anemia  Provider:  Chilton Greathouse MD  Procedure: IV Push  IV Type: Peripheral, IV Location: R Antecubital  Venofer (Iron Sucrose), Dose: 200 mg  Post Infusion IV Care: Observation period completed and Peripheral IV Discontinued  Discharge: Condition: Good, Destination: Home . AVS Declined  Performed by:  Rico Ala, LPN

## 2023-03-31 ENCOUNTER — Ambulatory Visit: Payer: Medicare HMO | Admitting: *Deleted

## 2023-03-31 VITALS — BP 151/73 | HR 71 | Temp 98.2°F | Resp 16 | Ht 71.0 in | Wt 244.6 lb

## 2023-03-31 DIAGNOSIS — D500 Iron deficiency anemia secondary to blood loss (chronic): Secondary | ICD-10-CM | POA: Diagnosis not present

## 2023-03-31 DIAGNOSIS — K31819 Angiodysplasia of stomach and duodenum without bleeding: Secondary | ICD-10-CM | POA: Diagnosis not present

## 2023-03-31 MED ORDER — DIPHENHYDRAMINE HCL 25 MG PO CAPS
25.0000 mg | ORAL_CAPSULE | Freq: Once | ORAL | Status: DC
Start: 2023-03-31 — End: 2023-03-31

## 2023-03-31 MED ORDER — ACETAMINOPHEN 325 MG PO TABS
650.0000 mg | ORAL_TABLET | Freq: Once | ORAL | Status: DC
Start: 2023-03-31 — End: 2023-03-31

## 2023-03-31 MED ORDER — IRON SUCROSE 20 MG/ML IV SOLN
200.0000 mg | Freq: Once | INTRAVENOUS | Status: AC
  Administered 2023-03-31: 200 mg via INTRAVENOUS
  Filled 2023-03-31: qty 10

## 2023-03-31 NOTE — Progress Notes (Signed)
Diagnosis: Iron Deficiency Anemia  Provider:  Chilton Greathouse MD  Procedure: IV Push  IV Type: Peripheral, IV Location: R Upper Arm  Venofer (Iron Sucrose), Dose: 200 mg  Post Infusion IV Care: Observation period completed  Discharge: Condition: Good, Destination: Home . AVS Declined  Performed by:  Forrest Moron, RN

## 2023-03-31 NOTE — Progress Notes (Deleted)
Diagnosis: Iron Deficiency Anemia  Provider:  Chilton Greathouse MD  Procedure: IV Infusion  IV Type: Peripheral, IV Location: R Upper Arm  Venofer (Iron Sucrose), Dose: 200 mg  Infusion Start Time: 1329 pm  {Infusion Stop Time:25400}  Post Infusion IV Care: Observation period completed and Peripheral IV Discontinued  Discharge: Condition: Good, Destination: Home . AVS Declined  Performed by:  Forrest Moron, RN

## 2023-04-07 ENCOUNTER — Ambulatory Visit: Payer: Medicare HMO

## 2023-04-07 VITALS — BP 153/71 | HR 62 | Temp 97.8°F | Resp 16 | Ht 71.0 in | Wt 243.0 lb

## 2023-04-07 DIAGNOSIS — D500 Iron deficiency anemia secondary to blood loss (chronic): Secondary | ICD-10-CM | POA: Diagnosis not present

## 2023-04-07 DIAGNOSIS — K31819 Angiodysplasia of stomach and duodenum without bleeding: Secondary | ICD-10-CM

## 2023-04-07 MED ORDER — IRON SUCROSE 20 MG/ML IV SOLN
200.0000 mg | Freq: Once | INTRAVENOUS | Status: AC
  Administered 2023-04-07: 200 mg via INTRAVENOUS
  Filled 2023-04-07: qty 10

## 2023-04-07 MED ORDER — ACETAMINOPHEN 325 MG PO TABS
650.0000 mg | ORAL_TABLET | Freq: Once | ORAL | Status: DC
Start: 2023-04-07 — End: 2023-04-07

## 2023-04-07 MED ORDER — DIPHENHYDRAMINE HCL 25 MG PO CAPS: 25.0000 mg | ORAL_CAPSULE | Freq: Once | ORAL | Status: DC

## 2023-04-07 NOTE — Progress Notes (Signed)
 Diagnosis: Iron Deficiency Anemia  Provider:  Chilton Greathouse MD  Procedure: IV Push  IV Type: Peripheral, IV Location: R Forearm  Venofer (Iron Sucrose), Dose: 200 mg  Post Infusion IV Care: Patient declined observation and Peripheral IV Discontinued  Discharge: Condition: Good, Destination: Home . AVS Declined  Performed by:  Adriana Mccallum, RN

## 2023-04-14 ENCOUNTER — Ambulatory Visit: Payer: Medicare HMO

## 2023-04-14 VITALS — BP 150/73 | HR 75 | Temp 97.8°F | Resp 20 | Ht 71.0 in | Wt 242.4 lb

## 2023-04-14 DIAGNOSIS — K31819 Angiodysplasia of stomach and duodenum without bleeding: Secondary | ICD-10-CM | POA: Diagnosis not present

## 2023-04-14 DIAGNOSIS — D500 Iron deficiency anemia secondary to blood loss (chronic): Secondary | ICD-10-CM | POA: Diagnosis not present

## 2023-04-14 MED ORDER — IRON SUCROSE 20 MG/ML IV SOLN
200.0000 mg | Freq: Once | INTRAVENOUS | Status: AC
Start: 2023-04-14 — End: 2023-04-14
  Administered 2023-04-14: 200 mg via INTRAVENOUS
  Filled 2023-04-14: qty 10

## 2023-04-14 MED ORDER — DIPHENHYDRAMINE HCL 25 MG PO CAPS: 25.0000 mg | ORAL_CAPSULE | Freq: Once | ORAL | Status: DC

## 2023-04-14 MED ORDER — ACETAMINOPHEN 325 MG PO TABS: 650.0000 mg | ORAL_TABLET | Freq: Once | ORAL | Status: DC

## 2023-04-14 NOTE — Progress Notes (Signed)
Diagnosis: Iron Deficiency Anemia  Provider:  Chilton Greathouse MD  Procedure: IV Push  IV Type: Peripheral, IV Location: R Antecubital  Venofer (Iron Sucrose), Dose: 200 mg  Post Infusion IV Care: Patient declined observation  Discharge: Condition: Good, Destination: Home . AVS Declined  Performed by:  Adriana Mccallum, RN

## 2023-04-20 ENCOUNTER — Ambulatory Visit: Payer: Medicare HMO

## 2023-04-20 VITALS — BP 154/80 | HR 66 | Temp 97.8°F | Resp 18 | Ht 71.0 in | Wt 242.2 lb

## 2023-04-20 DIAGNOSIS — D500 Iron deficiency anemia secondary to blood loss (chronic): Secondary | ICD-10-CM | POA: Diagnosis not present

## 2023-04-20 DIAGNOSIS — K31819 Angiodysplasia of stomach and duodenum without bleeding: Secondary | ICD-10-CM

## 2023-04-20 MED ORDER — IRON SUCROSE 20 MG/ML IV SOLN
200.0000 mg | Freq: Once | INTRAVENOUS | Status: AC
  Administered 2023-04-20: 200 mg via INTRAVENOUS

## 2023-04-20 MED ORDER — DIPHENHYDRAMINE HCL 25 MG PO CAPS: 25.0000 mg | ORAL_CAPSULE | Freq: Once | ORAL | Status: DC

## 2023-04-20 MED ORDER — ACETAMINOPHEN 325 MG PO TABS: 650.0000 mg | ORAL_TABLET | Freq: Once | ORAL | Status: DC

## 2023-04-20 NOTE — Progress Notes (Signed)
Diagnosis: Iron Deficiency Anemia  Provider:  Chilton Greathouse MD  Procedure: IV Push  IV Type: Peripheral, IV Location: R Forearm  Cathflo (Altepase), Venofer (Iron Sucrose), Dose: 200 mg  Post Infusion IV Care: Patient declined observation  Discharge: Condition: Good, Destination: Home . AVS Declined  Performed by:  Adriana Mccallum, RN

## 2023-06-24 ENCOUNTER — Encounter: Payer: Self-pay | Admitting: Gastroenterology

## 2023-08-10 ENCOUNTER — Telehealth: Payer: Self-pay | Admitting: *Deleted

## 2023-08-10 DIAGNOSIS — K7460 Unspecified cirrhosis of liver: Secondary | ICD-10-CM

## 2023-08-10 NOTE — Telephone Encounter (Signed)
-----   Message from Nurse Deno Etienne sent at 07/04/2023  9:58 AM EST ----- Needs u/s and afp around 08/2023; cirig pt

## 2023-08-10 NOTE — Telephone Encounter (Signed)
 Patient is scheduled for RUQ ultrasound 08/23/23 at 930 am, 915 am arrival, NPO midnight. I have spoken to patient to advise him of this information and he verbalizes understanding. He is also asked to come to our lab following ultrasound 08/23/23 to have AFP tumor marker drawn. Order already in EPIC.

## 2023-08-23 ENCOUNTER — Other Ambulatory Visit

## 2023-08-23 ENCOUNTER — Ambulatory Visit (HOSPITAL_COMMUNITY)
Admission: RE | Admit: 2023-08-23 | Discharge: 2023-08-23 | Disposition: A | Discharge status: Home / Self Care | Source: Ambulatory Visit | Attending: Gastroenterology | Admitting: Gastroenterology

## 2023-08-23 DIAGNOSIS — K7581 Nonalcoholic steatohepatitis (NASH): Secondary | ICD-10-CM | POA: Diagnosis present

## 2023-08-23 DIAGNOSIS — K7460 Unspecified cirrhosis of liver: Secondary | ICD-10-CM | POA: Insufficient documentation

## 2023-08-25 LAB — AFP TUMOR MARKER: AFP-Tumor Marker: 2.3 ng/mL (ref <6.1)

## 2023-09-21 ENCOUNTER — Ambulatory Visit: Payer: Medicare HMO | Admitting: Gastroenterology

## 2023-09-21 ENCOUNTER — Other Ambulatory Visit (INDEPENDENT_AMBULATORY_CARE_PROVIDER_SITE_OTHER)

## 2023-09-21 ENCOUNTER — Encounter: Payer: Self-pay | Admitting: Gastroenterology

## 2023-09-21 VITALS — BP 122/62 | HR 76 | Ht 71.0 in | Wt 137.0 lb

## 2023-09-21 DIAGNOSIS — D696 Thrombocytopenia, unspecified: Secondary | ICD-10-CM

## 2023-09-21 DIAGNOSIS — Z860100 Personal history of colon polyps, unspecified: Secondary | ICD-10-CM

## 2023-09-21 DIAGNOSIS — R161 Splenomegaly, not elsewhere classified: Secondary | ICD-10-CM

## 2023-09-21 DIAGNOSIS — K3189 Other diseases of stomach and duodenum: Secondary | ICD-10-CM

## 2023-09-21 DIAGNOSIS — K7460 Unspecified cirrhosis of liver: Secondary | ICD-10-CM

## 2023-09-21 DIAGNOSIS — D509 Iron deficiency anemia, unspecified: Secondary | ICD-10-CM | POA: Diagnosis not present

## 2023-09-21 DIAGNOSIS — K766 Portal hypertension: Secondary | ICD-10-CM

## 2023-09-21 DIAGNOSIS — K7581 Nonalcoholic steatohepatitis (NASH): Secondary | ICD-10-CM

## 2023-09-21 DIAGNOSIS — K5520 Angiodysplasia of colon without hemorrhage: Secondary | ICD-10-CM

## 2023-09-21 DIAGNOSIS — K31819 Angiodysplasia of stomach and duodenum without bleeding: Secondary | ICD-10-CM

## 2023-09-21 DIAGNOSIS — E7220 Disorder of urea cycle metabolism, unspecified: Secondary | ICD-10-CM

## 2023-09-21 DIAGNOSIS — Z860101 Personal history of adenomatous and serrated colon polyps: Secondary | ICD-10-CM

## 2023-09-21 LAB — IBC + FERRITIN
Ferritin: 3.7 ng/mL — ABNORMAL LOW (ref 22.0–322.0)
Iron: 23 ug/dL — ABNORMAL LOW (ref 42–165)
Saturation Ratios: 5 % — ABNORMAL LOW (ref 20.0–50.0)
TIBC: 463.4 ug/dL — ABNORMAL HIGH (ref 250.0–450.0)
Transferrin: 331 mg/dL (ref 212.0–360.0)

## 2023-09-21 LAB — CBC
HCT: 36.6 % — ABNORMAL LOW (ref 39.0–52.0)
Hemoglobin: 11.5 g/dL — ABNORMAL LOW (ref 13.0–17.0)
MCHC: 31.4 g/dL (ref 30.0–36.0)
MCV: 78.3 fl (ref 78.0–100.0)
Platelets: 100 10*3/uL — ABNORMAL LOW (ref 150.0–400.0)
RBC: 4.68 Mil/uL (ref 4.22–5.81)
RDW: 16.9 % — ABNORMAL HIGH (ref 11.5–15.5)
WBC: 4.9 10*3/uL (ref 4.0–10.5)

## 2023-09-21 MED ORDER — LACTULOSE 10 GM/15ML PO SOLN: 30.0000 g | Freq: Every day | ORAL | 11 refills | Status: AC

## 2023-09-21 NOTE — Patient Instructions (Addendum)
 _______________________________________________________  If your blood pressure at your visit was 140/90 or greater, please contact your primary care physician to follow up on this. _______________________________________________________  If you are age 74 or older, your body mass index should be between 23-30. Your Body mass index is 19.11 kg/m. If this is out of the aforementioned range listed, please consider follow up with your Primary Care Provider. _______________________________________________________  The [facility] providers would like to encourage you to use MYCHART to communicate with providers for non-urgent requests or questions.  Due to long hold times on the telephone, sending your provider a message by [facility] may be a faster and more efficient way to get a response.  Please allow 48 business hours for a response.  Please remember that this is for non-urgent requests.  _______________________________________________________  We have sent the following medications to your pharmacy for you to pick up at your convenience:  START: Lactulose   Your provider has requested that you go to the basement level for lab work before leaving today. Press "B" on the elevator. The lab is located at the first door on the left as you exit the elevator.  Due to recent changes in healthcare laws, you may see the results of your imaging and laboratory studies on MyChart before your provider has had a chance to review them.  We understand that in some cases there may be results that are confusing or concerning to you. Not all laboratory results come back in the same time frame and the provider may be waiting for multiple results in order to interpret others.  Please give us  48 hours in order for your provider to thoroughly review all the results before contacting the office for clarification of your results.   It was a pleasure to see you today!  Vito Cirigliano, D.O.

## 2023-09-21 NOTE — Progress Notes (Signed)
 Chief Complaint:    Cirrhosis  GI History: 73 year old male with history of HTN, HLD, mild nonobstructive CAD with normal LV function, CVA 2011, OSA (intolerant to CPAP), diabetes, nephrolithiasis, GERD, thrombocytopenia, anemia.  Hospital admission 07/08/2021 for symptomatic IDA, treated with IV iron , high-dose PPI.   - 07/10/2021: EGD: Single nonbleeding AVM in stomach treated with APC, mild GAVE treated with APC.  Gastric biopsies negative for H. pylori - 07/10/2021: Colonoscopy: Several colonic AVMs treated with APC, two 3 mm tubular adenomas removed from ascending colon, 4 mm adenoma removed from rectum.  Solitary rectal ulcer - 07/11/2021: Abdominal ultrasound: Diffusely coarsened hepatic echotexture, splenomegaly with patent portal vein - 08/20/2021: H/H 12.7/38.6, PLT 81.  AST 48, otherwise normal liver enzymes. Normal/negative A1 AT, ANA, AMA, ASMA, HCV, HBV (needs hepatitis B vaccine), HAV Ab+.  FIB-4 5.96, NASH FibroSURE fibrosis score 0.54 (F2, bridging fibrosis with few septa), steatosis score 0.8 (S3, marked or severe steatosis), NASH score 0.75 (N2, definite NASH) -09/15/2021: Ultrasound elastography: Hepatic steatosis on ultrasound portion but normal elastography (median kPa 5.6), essentially ruling out compensated advanced chronic liver disease   - 11/11/2022: Enteroscopy: Moderate GAVE treated with APC, moderate portal hypertensive gastropathy, normal esophagus and small bowel - 11/11/2022: Colonoscopy: 5 AVMs in the descending/transverse colon treated with APC, otherwise normal - IV iron  - 02/22/2023: RUQ US : Cirrhotic appearing liver.  Negative for HCC.  aFP normal - 4/1/does not 25: RUQ US : Cirrhotic appearing liver, negative for HCC.  aFP normal.  HPI:     Patient is a 73 y.o. male presenting to the Gastroenterology Clinic for routine follow-up.  Last seen by me in the office on 02/22/2023.  Aside from episodic imbalance in the mornings at times, he was otherwise doing well.  No  confusion, hepatic encephalopathy, bleeding, etc. Labs at that time notable for ammonia mildly elevated at 52, suspicious for early hepatic encephalopathy and was started on lactulose  30 g daily (never started).  Labs also with uptrending H/H at 12/38, but still iron  deficient with ferritin 4.1, iron  30, TIBC 451, sat 6.7%.,  And was referred to the Hematology Clinic.  aFP normal.  He was seen in the Hematology Clinic on 03/11/2023 and treated with IV iron .  Otherwise normal B12 and folate.  AFP normal last month.  RUQ US  last month with cirrhotic appearing liver but no HCC.  Today, states he is feeling well.  No active issues or concerns today.  Again presents with his son and wife.  No longer having episodic imbalance in the mornings.  Does regularly take naps in the afternoons.  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  Review of systems:     No chest pain, no SOB, no fevers, no urinary sx   Past Medical History:  Diagnosis Date   Chest pain    a. 10.2006 Cath: nl cors, EF 65%;  b. 12/2008 Negative myoview.   DDD (degenerative disc disease), cervical    a. 08/2008 s/p R L4-5 diskectomy, foaminotomy, and lysis of adhesions.   Diabetes (HCC)    GERD (gastroesophageal reflux disease)    Gout    History of kidney stones    Hyperlipidemia    Hypertension    OSA (obstructive sleep apnea)    a. on cpap  NOT ABLE TO USE   Palpitations    a. 11/2008 - nl holter monitor;  b. 04/2010 Echo: EF 50-55%, Gr 1 DD.   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 Added automatically from request for surgery [phone] Formatting of this note might be different from the original. Added automatically from request for surgery [id number]     Precordial pain 07/17/2012   Skin cancer    Sleep apnea    Stroke [facility])    a. 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    Patient's surgical history, family medical history, social history,  medications and allergies were all reviewed in Epic    Current Outpatient Medications  Medication Sig Dispense Refill   acetaminophen  (TYLENOL ) 500 MG tablet Take 1,000 mg by mouth every 6 (six) hours as needed for mild pain (pain score 1-3).     allopurinol  (ZYLOPRIM ) 300 MG tablet Take 150 mg by mouth at bedtime.     aspirin  EC 81 MG tablet Take 81 mg by mouth every evening.     empagliflozin (JARDIANCE) 25 MG TABS tablet Take 25 mg by mouth daily.     fexofenadine (ALLEGRA) 180 MG tablet Take 180 mg by mouth daily as needed (allergies).     lactulose  (CHRONULAC ) 10 GM/15ML solution Take 45 mLs (30 g total) by mouth daily. 1350 mL 1   metoprolol  succinate (TOPROL -XL) 50 MG 24 hr tablet Take 50 mg by mouth at bedtime.     Multiple Vitamins-Minerals (CENTRUM SILVER PO) Take 1 tablet by mouth daily.     ondansetron  (ZOFRAN ) 4 MG tablet Take 4 mg by mouth every 8 (eight) hours as needed for nausea or vomiting.     pantoprazole  (PROTONIX ) 40 MG tablet Take 40 mg by mouth 2 (two) times daily before a meal.     No current facility-administered medications for this visit.    Physical Exam:     BP 122/62   Pulse 76   Ht 5\' 11"  (1.803 m)   Wt 137 lb (62.1 kg)   BMI 19.11 kg/m   GENERAL:  Pleasant male in NAD PSYCH: : Cooperative, normal affect Musculoskeletal:  Normal muscle tone, normal strength NEURO: Alert and oriented x 3, no focal neurologic deficits   IMPRESSION and PLAN:    1) MASH  2) Thrombocytopenia 3) Splenomegaly 4) Portal hypertensive gastropathy 73 year old male with history of intermittently elevated liver enzymes which have since normalized. Elevated fib 4 score and NASH FibroSure, but interestingly ultrasound elastography with just hepatic steatosis but normal elastography portion.  Does have a history of thrombocytopenia and minimally elevated INR at 1.2, but otherwise normal albumin , bilirubin, preserved renal function.   Ultimately, I suspect he has early  cirrhosis based on imaging.  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   Also suspicious that he possibly has subclinical (or possibly grade 1) hepatic encephalopathy, but also discussed potential non-hepatic etiologies for minimally elevated ammonia.  Has other potential etiologies for altered sleep/wake cycle and daytime fatigue, to include sleep issues (not sure if that represents altered sleep/wake cycle related to HE or as he describes racing thoughts that wake him at night regarding his business, poor sleep as he is noncompliant with his CPAP, etc.).  Recent EGD otherwise without esophageal varices.   - UTD on hepatoma screening.  Repeat ultrasound/AFP in 6 months.   - Start laculose.  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  Recently treated with IV iron .  Most recent enteroscopy/colonoscopy in 10/2022 with GAVE treated with APC and 5 colonic AVMs treated with APC - Repeat CBC check and iron  panel to evaluate response to IV iron  - Repeat EGD/colonoscopy prn bleeding, anemia, etc.   8) History of colon polyps Colonoscopy in 06/2021 with 4 mm adenoma.  More recent colonoscopy in 10/2022 without any polyps.  Based on age and comorbidities, suspect we can forego colonoscopies in the future from a screening/surveillance standpoint     RTC in 12 months or sooner prn   Allina Riches V Neyra Pettie ,DO, FACG 09/21/2023, 2:54 PM

## 2024-02-23 ENCOUNTER — Other Ambulatory Visit: Payer: Self-pay

## 2024-02-23 DIAGNOSIS — K7469 Other cirrhosis of liver: Secondary | ICD-10-CM

## 2024-03-06 ENCOUNTER — Ambulatory Visit (HOSPITAL_COMMUNITY)
Admission: RE | Admit: 2024-03-06 | Discharge: 2024-03-06 | Disposition: A | Discharge status: Home / Self Care | Source: Ambulatory Visit | Attending: Gastroenterology | Admitting: Gastroenterology

## 2024-03-06 ENCOUNTER — Other Ambulatory Visit (INDEPENDENT_AMBULATORY_CARE_PROVIDER_SITE_OTHER)

## 2024-03-06 DIAGNOSIS — K7469 Other cirrhosis of liver: Secondary | ICD-10-CM | POA: Diagnosis not present

## 2024-03-06 DIAGNOSIS — K7581 Nonalcoholic steatohepatitis (NASH): Secondary | ICD-10-CM | POA: Diagnosis present

## 2024-03-08 LAB — AFP TUMOR MARKER: AFP-Tumor Marker: 2.1 ng/mL (ref <6.1)

## 2024-03-12 ENCOUNTER — Ambulatory Visit: Payer: Self-pay | Admitting: Gastroenterology

## 2024-04-16 ENCOUNTER — Ambulatory Visit

## 2024-04-16 ENCOUNTER — Ambulatory Visit: Attending: Cardiovascular Disease | Admitting: Cardiovascular Disease

## 2024-04-16 ENCOUNTER — Encounter: Payer: Self-pay | Admitting: Cardiovascular Disease

## 2024-04-16 VITALS — BP 132/54 | HR 71 | Ht 71.0 in | Wt 240.0 lb

## 2024-04-16 DIAGNOSIS — R002 Palpitations: Secondary | ICD-10-CM

## 2024-04-16 DIAGNOSIS — I2510 Atherosclerotic heart disease of native coronary artery without angina pectoris: Secondary | ICD-10-CM

## 2024-04-16 DIAGNOSIS — I10 Essential (primary) hypertension: Secondary | ICD-10-CM | POA: Diagnosis not present

## 2024-04-16 DIAGNOSIS — G4733 Obstructive sleep apnea (adult) (pediatric): Secondary | ICD-10-CM | POA: Diagnosis not present

## 2024-04-16 MED ORDER — METOPROLOL SUCCINATE ER 50 MG PO TB24: 50.0000 mg | ORAL_TABLET | Freq: Every day | ORAL | 3 refills | Status: AC

## 2024-04-16 NOTE — Progress Notes (Signed)
 Chief Complaint  Patient presents with   Follow-up    CAD   History of Present Illness: 73 yo male with history of diabetes mellitus, chest pain, gout, GERD, HTN, hyperlipidemia, sleep apnea on CPAP and prior stroke who is here today for cardiac follow up. He was seen in our office in 2022 for the evaluation of chest pain. He described central chest pain for several weeks with associated dyspnea and left arm tingling. He was seen in primary care and had a stress test arranged. Nuclear stress test at [facility] 07/18/20 with no evidence of ischemia. LVEF=65%.  Cardiac cath 07/25/20 with mild non-obstructive CAD (20% proximal RCA stenosis, 20% mid Circumflex stenosis). LV systolic function normal by LV gram. Normal LV filling pressures. Echo 08/08/20 with LVEF=60-65%, moderate basal septal hypertrophy. No significant valve disease. He has anemia secondary to AVMs. He has not tolerated statins.   He is here today for follow up. The patient denies any chest pain, dyspnea, lower extremity edema, orthopnea, PND. He has been feeling his heart skip beats and pound during the day. He has some episodes of dizziness but no syncope.    His former daughter-in-law works in our cath lab [employer]).  Primary Care Physician: Maree Arlean POUR, MD   Past Medical History:  Diagnosis Date   Chest pain    a. 10.2006 Cath: nl cors, EF 65%;  b. 12/2008 Negative myoview.   DDD (degenerative disc disease), cervical    a. 08/2008 s/p R L4-5 diskectomy, foaminotomy, and lysis of adhesions.   Diabetes (HCC)    GERD (gastroesophageal reflux disease)    Gout    History of kidney stones    Hyperlipidemia    Hypertension    OSA (obstructive sleep apnea)    a. on cpap  NOT ABLE TO USE   Palpitations    a. 11/2008 - nl holter monitor;  b. 04/2010 Echo: EF 50-55%, Gr 1 DD.   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 Added  automatically from request for surgery [phone] Formatting of this note might be different from the original. Added automatically from request for surgery [id number]     Precordial pain 07/17/2012   Skin cancer    Sleep apnea    Stroke [facility])    a. 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    Past Surgical History:  Procedure Laterality Date   APPENDECTOMY     BACK SURGERY     BIOPSY  07/10/2021   Procedure: BIOPSY;  Surgeon: Leigh Elspeth SQUIBB, MD;  Location: [facility] ENDOSCOPY;  Service: Gastroenterology;;   BIOPSY  11/11/2022   Procedure: BIOPSY;  Surgeon: San Sandor GAILS, DO;  Location: [facility] ENDOSCOPY;  Service: Gastroenterology;;   CATARACT EXTRACTION     RIGHT   COLONOSCOPY WITH PROPOFOL  N/A 07/10/2021   Procedure: COLONOSCOPY WITH PROPOFOL ;  Surgeon: Leigh Elspeth SQUIBB, MD;  Location: [facility] ENDOSCOPY;  Service: Gastroenterology;  Laterality: N/A;   COLONOSCOPY WITH PROPOFOL  N/A 11/11/2022   Procedure: COLONOSCOPY WITH PROPOFOL ;  Surgeon: San Sandor GAILS, DO;  Location: [facility] ENDOSCOPY;  Service: Gastroenterology;  Laterality: N/A;   ENTEROSCOPY N/A 11/11/2022   Procedure: ENTEROSCOPY;  Surgeon: San Sandor GAILS, DO;  Location: [facility] ENDOSCOPY;  Service: Gastroenterology;  Laterality: N/A;  push enteroscopy   ESOPHAGOGASTRODUODENOSCOPY (EGD) WITH PROPOFOL  N/A 07/10/2021   Procedure: ESOPHAGOGASTRODUODENOSCOPY (EGD) WITH PROPOFOL ;  Surgeon: Leigh Elspeth SQUIBB, MD;  Location: [facility] ENDOSCOPY;  Service:  Gastroenterology;  Laterality: N/A;   FOOT SURGERY Right    HEMORROIDECTOMY     HOT HEMOSTASIS N/A 07/10/2021   Procedure: HOT HEMOSTASIS (ARGON PLASMA COAGULATION/BICAP);  Surgeon: Leigh Elspeth SQUIBB, MD;  Location: [facility] ENDOSCOPY;  Service: Gastroenterology;  Laterality: N/A;   HOT HEMOSTASIS N/A 11/11/2022   Procedure: HOT HEMOSTASIS (ARGON PLASMA COAGULATION/BICAP);  Surgeon: San Sandor GAILS, DO;  Location: [facility] ENDOSCOPY;  Service: Gastroenterology;  Laterality: N/A;   KNEE SURGERY      LEFT HEART CATHETERIZATION WITH CORONARY ANGIOGRAM N/A 07/18/2012   Procedure: LEFT HEART CATHETERIZATION WITH CORONARY ANGIOGRAM;  Surgeon: Lonni JONETTA Cash, MD;  Location: [facility] CATH LAB;  Service: Cardiovascular;  Laterality: N/A;   LUMBAR LAMINECTOMY/DECOMPRESSION MICRODISCECTOMY Left 01/27/2016   Procedure: Left Lumbar Four-Five, Lumbar FIve-Sacral One Laminectomy/Foraminotomy;  Surgeon: Catalina Stains, MD;  Location: [facility] NEURO ORS;  Service: Neurosurgery;  Laterality: Left;  Left L4-5 L5-S1 Laminectomy/Foraminotomy   POLYPECTOMY  07/10/2021   Procedure: POLYPECTOMY;  Surgeon: Leigh Elspeth SQUIBB, MD;  Location: [facility] ENDOSCOPY;  Service: Gastroenterology;;   RIGHT/LEFT HEART CATH AND CORONARY ANGIOGRAPHY N/A 07/25/2020   Procedure: RIGHT/LEFT HEART CATH AND CORONARY ANGIOGRAPHY;  Surgeon: Cash Lonni JONETTA, MD;  Location: [facility] INVASIVE CV LAB;  Service: Cardiovascular;  Laterality: N/A;   skin cancer excision  05/2022   squamous cell carcinoma, Left shoulder   TONSILLECTOMY      Current Outpatient Medications  Medication Sig Dispense Refill   acetaminophen  (TYLENOL ) 500 MG tablet Take 1,000 mg by mouth every 6 (six) hours as needed for mild pain (pain score 1-3).     allopurinol  (ZYLOPRIM ) 300 MG tablet Take 150 mg by mouth at bedtime.     aspirin  EC 81 MG tablet Take 81 mg by mouth every evening.     empagliflozin (JARDIANCE) 25 MG TABS tablet Take 25 mg by mouth daily.     fexofenadine (ALLEGRA) 180 MG tablet Take 180 mg by mouth daily as needed (allergies).     lactulose  (CHRONULAC ) 10 GM/15ML solution Take 45 mLs (30 g total) by mouth daily. 1350 mL 11   Multiple Vitamins-Minerals (CENTRUM SILVER PO) Take 1 tablet by mouth daily.     ondansetron  (ZOFRAN ) 4 MG tablet Take 4 mg by mouth every 8 (eight) hours as needed for nausea or vomiting.     pantoprazole  (PROTONIX ) 40 MG tablet Take 40 mg by mouth 2 (two) times daily before a meal.     metoprolol  succinate (TOPROL -XL) 50 MG 24  hr tablet Take 1 tablet (50 mg total) by mouth at bedtime. 90 tablet 3   No current facility-administered medications for this visit.    Allergies  Allergen Reactions   Iodinated Contrast Media Other (See Comments)    STROKE  Other Reaction(s): GI Intolerance   Gadolinium Other (See Comments)     Code: VOM, Desc: unusual reaction, patient was nauseous, lost consciousness, Onset Date: 87767988     Social History   Socioeconomic History   Marital status: Married    Spouse name: Merlynn   Number of children: 1   Years of education: Not on file   Highest education level: Not on file  Occupational History   Occupation: He owns a museum/gallery exhibitions officer  Tobacco Use   Smoking status: Former    Current packs/day: 0.00    Types: Cigarettes    Quit date: 05/25/1983    Years since quitting: 40.9   Smokeless tobacco: Never   Tobacco comments:    quit smoking in 1986  Vaping  Use   Vaping status: Never Used  Substance and Sexual Activity   Alcohol  use: No    Comment: former - quit driking in 1983.   Drug use: No   Sexual activity: Not on file  Other Topics Concern   Not on file  Social History Narrative   Lives in [place], [place] with spouse.  Works as a naval architect.   Social Drivers of Corporate Investment Banker Strain: Not on file  Food Insecurity: Not on file  Transportation Needs: Not on file  Physical Activity: Not on file  Stress: Not on file  Social Connections: Not on file  Intimate Partner Violence: Not on file    Family History  Problem Relation Age of Onset   Stomach cancer Mother    Heart attack Father    Colon cancer Neg Hx    Pancreatic cancer Neg Hx    Esophageal cancer Neg Hx    Colon polyps Neg Hx     Review of Systems:  As stated in the HPI and otherwise negative.   BP (!) 132/54   Pulse 71   Ht 5' 11 (1.803 m)   Wt 240 lb (108.9 kg)   SpO2 95%   BMI 33.47 kg/m   Physical Examination: General: Well developed, well nourished, NAD  HEENT: OP clear,  mucus membranes moist  SKIN: warm, dry. No rashes. Neuro: No focal deficits  Musculoskeletal: Muscle strength 5/5 all ext  Psychiatric: Mood and affect normal  Neck: No JVD, no carotid bruits, no thyromegaly, no lymphadenopathy.  Lungs:Clear bilaterally, no wheezes, rhonci, crackles Cardiovascular: Regular rate and rhythm. No murmurs, gallops or rubs. Abdomen:Soft. Bowel sounds present. Non-tender.  Extremities: No lower extremity edema. Pulses are 2 + in the bilateral DP/PT.  EKG:  EKG is  ordered today. 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 CAD without angina: Mild CAD by cardiac cath March 2022. Continue  ASA. He has a low LDL (25) on no statin therapy. He does not tolerate statins.   2. HTN: BP is controlled. Continue Toprol .   3. Sleep apnea: He is on CPAP and tolerating well.   4. Palpitations: He has been feeling his heart race and skip. EKG is normal today. Will arrange 14 day Zio cardiac monitor. Continue Toprol . Echo to assess LVEF.   Labs/ tests ordered today include:  Orders Placed This Encounter  Procedures   LONG TERM MONITOR (3-14 DAYS)   EKG 12-Lead   ECHOCARDIOGRAM COMPLETE   Disposition:   F/U with me in one  year.    Signed, Bruckner Verlin, MD 04/16/2024 2:22 PM  [facility] [address] [place], [place], [place]  [address] Phone: [phone]; Fax: [phone]

## 2024-04-16 NOTE — Patient Instructions (Addendum)
 Medication Instructions:  Your physician recommends that you continue on your current medications as directed. Please refer to the Current Medication list given to you today.  *If you need a refill on your cardiac medications before your next appointment, please call your pharmacy*  Lab Work: none If you have labs (blood work) drawn today and your tests are completely normal, you will receive your results only by: MyChart Message (if you have MyChart) OR A paper copy in the mail If you have any lab test that is abnormal or we need to change your treatment, we will call you to review the results.  Testing/Procedures: Your physician has requested that you have an echocardiogram. Echocardiography is a painless test that uses sound waves to create images of your heart. It provides your doctor with information about the size and shape of your heart and how well your heart's chambers and valves are working. This procedure takes approximately one hour. There are no restrictions for this procedure. Please do NOT wear cologne, perfume, aftershave, or lotions (deodorant is allowed). Please arrive 15 minutes prior to your appointment time.  Please note: We ask at that you not bring children with you during ultrasound (echo/ vascular) testing. Due to room size and safety concerns, children are not allowed in the ultrasound rooms during exams. Our front office staff cannot provide observation of children in our lobby area while testing is being conducted. An adult accompanying a patient to their appointment will only be allowed in the ultrasound room at the discretion of the ultrasound technician under special circumstances. We apologize for any inconvenience.  Dr Verlin recommends you wear a 14 day zio monitor  Follow-Up: At [facility], you and your health needs are our priority.  As part of our continuing mission to provide you with exceptional heart care, our providers are all part of one  team.  This team includes your primary Cardiologist (physician) and Advanced Practice Providers or APPs (Physician Assistants and Nurse Practitioners) who all work together to provide you with the care you need, when you need it.  Your next appointment:   12 month(s)  Provider:   Lonni Verlin, MD    We recommend signing up for the patient portal called MyChart.  Sign up information is provided on this After Visit Summary.  MyChart is used to connect with patients for Virtual Visits (Telemedicine).  Patients are able to view lab/test results, encounter notes, upcoming appointments, etc.  Non-urgent messages can be sent to your provider as well.   To learn more about what you can do with MyChart, go to [email or web address].   Other Instructions      ZIO XT- Long Term Monitor Instructions  Your physician has requested you wear a ZIO patch monitor for 14 days.  This is a single patch monitor. [employer] supplies one patch monitor per enrollment. Additional stickers are not available. Please do not apply patch if you will be having a Nuclear Stress Test,  Echocardiogram, Cardiac CT, MRI, or Chest Xray during the period you would be wearing the  monitor. The patch cannot be worn during these tests. You cannot remove and re-apply the  ZIO XT patch monitor.  Your ZIO patch monitor will be mailed 3 day USPS to your address on file. It may take 3-5 days  to receive your monitor after you have been enrolled.  Once you have received your monitor, please review the enclosed instructions. Your monitor  has already been registered assigning a specific monitor  serial # to you.  Billing and Patient Assistance Program Information  We have supplied [employer] with any of your insurance information on file for billing purposes. [employer] offers a sliding scale Patient Assistance Program for patients that do not have  insurance, or whose insurance does not completely cover the cost of the ZIO monitor.   You must apply for the Patient Assistance Program to qualify for this discounted rate.  To apply, please call [employer] at [phone], select option 4, select option 2, ask to apply for  Patient Assistance Program. Meredeth will ask your household income, and how many people  are in your household. They will quote your out-of-pocket cost based on that information.  [employer] will also be able to set up a 9-month, interest-free payment plan if needed.  Applying the monitor   Shave hair from upper left chest.  Hold abrader disc by orange tab. Rub abrader in 40 strokes over the upper left chest as  indicated in your monitor instructions.  Clean area with 4 enclosed alcohol  pads. Let dry.  Apply patch as indicated in monitor instructions. Patch will be placed under collarbone on left  side of chest with arrow pointing upward.  Rub patch adhesive wings for 2 minutes. Remove white label marked 1. Remove the white  label marked 2. Rub patch adhesive wings for 2 additional minutes.  While looking in a mirror, press and release button in center of patch. A small green light will  flash 3-4 times. This will be your only indicator that the monitor has been turned on.  Do not shower for the first 24 hours. You may shower after the first 24 hours.  Press the button if you feel a symptom. You will hear a small click. Record Date, Time and  Symptom in the Patient Logbook.  When you are ready to remove the patch, follow instructions on the last 2 pages of Patient  Logbook. Stick patch monitor onto the last page of Patient Logbook.  Place Patient Logbook in the blue and white box. Use locking tab on box and tape box closed  securely. The blue and white box has prepaid postage on it. Please place it in the mailbox as  soon as possible. Your physician should have your test results approximately 7 days after the  monitor has been mailed back to [facility].  Call [facility] Customer Care at  [phone] if you have questions regarding  your ZIO XT patch monitor. Call them immediately if you see an orange light blinking on your  monitor.  If your monitor falls off in less than 4 days, contact our Monitor department at [phone].  If your monitor becomes loose or falls off after 4 days call [employer] at [phone] for  suggestions on securing your monitor

## 2024-04-16 NOTE — Progress Notes (Unsigned)
 Enrolled for [employer] to mail a ZIO XT long term holter monitor to the patients address on file.

## 2024-05-07 ENCOUNTER — Telehealth: Payer: Self-pay | Admitting: Cardiovascular Disease

## 2024-05-07 NOTE — Telephone Encounter (Signed)
 Patient c/o Palpitations:  STAT if patient reporting lightheadedness, shortness of breath, or chest pain  How long have you had palpitations/irregular HR/ Afib? Are you having the symptoms now?   Yes  Are you currently experiencing lightheadedness, SOB or CP?   Lightheaded, SOB  Do you have a history of afib (atrial fibrillation) or irregular heart rhythm?   No  Have you checked your BP or HR? (document readings if available):   No  Are you experiencing any other symptoms?     Wife Jules) stated patient gets lightheaded when he stands up and is concerned patient stated his heart has racing.  Wife noted she is not with the patient at this time.

## 2024-05-07 NOTE — Telephone Encounter (Signed)
 I spoke with patient's wife.  She reports patient finished wearing monitor today and is going to mail it back tomorrow. Wife reports patient has been feeling his heart racing more today.  Also having more shortness of breath.  Is very pale and feels like he may pass out when standing up.  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  She is currently not with patient but son is with patient in his shop.   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 Wife does not know if patient has had lab work recently.  Does not know if stool is dark but will have patient check on this.  I told patient I would send message to Dr Verlin but if symptoms worsened he should go to ED for evaluation

## 2024-05-08 NOTE — Telephone Encounter (Signed)
 I spoke with patient.  He feels worn out with any activity.  ED precautions reviewed with patient.  I gave him message from Dr Verlin regarding seeing PCP and having lab work done there.

## 2024-05-18 ENCOUNTER — Ambulatory Visit: Payer: Self-pay | Admitting: Cardiovascular Disease

## 2024-05-18 DIAGNOSIS — R002 Palpitations: Secondary | ICD-10-CM | POA: Diagnosis not present

## 2024-05-28 ENCOUNTER — Ambulatory Visit (HOSPITAL_COMMUNITY)
Admission: RE | Admit: 2024-05-28 | Discharge: 2024-05-28 | Disposition: A | Discharge status: Home / Self Care | Source: Ambulatory Visit | Attending: Cardiovascular Disease | Admitting: Cardiovascular Disease

## 2024-05-28 DIAGNOSIS — I2510 Atherosclerotic heart disease of native coronary artery without angina pectoris: Secondary | ICD-10-CM | POA: Diagnosis present

## 2024-05-28 LAB — ECHOCARDIOGRAM COMPLETE
Area-P 1/2: 2.96 cm2
S' Lateral: 3.1 cm
